# Patient Record
Sex: Male | Born: 1980 | Race: White | Hispanic: No | Marital: Single | Smoking: Current every day smoker
Health system: Southern US, Community
[De-identification: ages and names within clinical notes are randomized; demographics above are authoritative.]

## PROBLEM LIST (undated history)

## (undated) DIAGNOSIS — R768 Other specified abnormal immunological findings in serum: Secondary | ICD-10-CM

## (undated) DIAGNOSIS — E7800 Pure hypercholesterolemia, unspecified: Secondary | ICD-10-CM

## (undated) DIAGNOSIS — R002 Palpitations: Secondary | ICD-10-CM

## (undated) DIAGNOSIS — F1910 Other psychoactive substance abuse, uncomplicated: Secondary | ICD-10-CM

## (undated) DIAGNOSIS — E669 Obesity, unspecified: Secondary | ICD-10-CM

## (undated) DIAGNOSIS — F419 Anxiety disorder, unspecified: Secondary | ICD-10-CM

## (undated) HISTORY — DX: Anxiety disorder, unspecified: F41.9

## (undated) HISTORY — DX: Obesity, unspecified: E66.9

## (undated) HISTORY — PX: OTHER SURGICAL HISTORY: SHX169

## (undated) HISTORY — DX: Palpitations: R00.2

## (undated) HISTORY — DX: Other psychoactive substance abuse, uncomplicated: F19.10

## (undated) HISTORY — DX: Other specified abnormal immunological findings in serum: R76.8

---

## 2002-03-13 ENCOUNTER — Emergency Department (HOSPITAL_COMMUNITY): Admission: EM | Admit: 2002-03-13 | Discharge: 2002-03-13 | Payer: Self-pay | Admitting: Emergency Medicine

## 2006-10-10 ENCOUNTER — Emergency Department (HOSPITAL_COMMUNITY): Admission: EM | Admit: 2006-10-10 | Discharge: 2006-10-11 | Payer: Self-pay | Admitting: *Deleted

## 2006-10-15 ENCOUNTER — Emergency Department (HOSPITAL_COMMUNITY): Admission: EM | Admit: 2006-10-15 | Discharge: 2006-10-15 | Payer: Self-pay | Admitting: Emergency Medicine

## 2007-02-12 DIAGNOSIS — F1910 Other psychoactive substance abuse, uncomplicated: Secondary | ICD-10-CM

## 2007-02-12 DIAGNOSIS — F411 Generalized anxiety disorder: Secondary | ICD-10-CM | POA: Insufficient documentation

## 2007-08-14 ENCOUNTER — Emergency Department (HOSPITAL_COMMUNITY): Admission: EM | Admit: 2007-08-14 | Discharge: 2007-08-14 | Payer: Self-pay | Admitting: Emergency Medicine

## 2007-09-07 ENCOUNTER — Emergency Department (HOSPITAL_COMMUNITY): Admission: EM | Admit: 2007-09-07 | Discharge: 2007-09-08 | Payer: Self-pay | Admitting: Emergency Medicine

## 2007-10-06 ENCOUNTER — Encounter (INDEPENDENT_AMBULATORY_CARE_PROVIDER_SITE_OTHER): Payer: Self-pay | Admitting: *Deleted

## 2007-11-06 ENCOUNTER — Ambulatory Visit: Payer: Self-pay | Admitting: Family Medicine

## 2007-11-06 DIAGNOSIS — F410 Panic disorder [episodic paroxysmal anxiety] without agoraphobia: Secondary | ICD-10-CM

## 2007-11-10 DIAGNOSIS — F17200 Nicotine dependence, unspecified, uncomplicated: Secondary | ICD-10-CM

## 2007-12-01 ENCOUNTER — Ambulatory Visit: Payer: Self-pay | Admitting: Internal Medicine

## 2007-12-16 ENCOUNTER — Ambulatory Visit: Payer: Self-pay | Admitting: Internal Medicine

## 2008-01-19 ENCOUNTER — Ambulatory Visit: Payer: Self-pay | Admitting: Internal Medicine

## 2008-01-19 DIAGNOSIS — E785 Hyperlipidemia, unspecified: Secondary | ICD-10-CM

## 2008-02-09 ENCOUNTER — Ambulatory Visit: Payer: Self-pay | Admitting: Internal Medicine

## 2008-03-11 ENCOUNTER — Ambulatory Visit: Payer: Self-pay | Admitting: Internal Medicine

## 2008-03-15 ENCOUNTER — Telehealth (INDEPENDENT_AMBULATORY_CARE_PROVIDER_SITE_OTHER): Payer: Self-pay | Admitting: Internal Medicine

## 2008-03-18 LAB — CONVERTED CEMR LAB
Direct LDL: 166.6 mg/dL
Total CHOL/HDL Ratio: 4.8
Triglycerides: 78 mg/dL (ref 0–149)

## 2008-03-22 ENCOUNTER — Telehealth (INDEPENDENT_AMBULATORY_CARE_PROVIDER_SITE_OTHER): Payer: Self-pay | Admitting: *Deleted

## 2008-03-22 DIAGNOSIS — J019 Acute sinusitis, unspecified: Secondary | ICD-10-CM

## 2008-03-29 ENCOUNTER — Ambulatory Visit: Payer: Self-pay | Admitting: Internal Medicine

## 2008-04-26 ENCOUNTER — Ambulatory Visit: Payer: Self-pay | Admitting: Internal Medicine

## 2008-05-24 ENCOUNTER — Ambulatory Visit: Payer: Self-pay | Admitting: Family Medicine

## 2008-05-25 ENCOUNTER — Encounter: Payer: Self-pay | Admitting: Family Medicine

## 2008-05-26 ENCOUNTER — Telehealth (INDEPENDENT_AMBULATORY_CARE_PROVIDER_SITE_OTHER): Payer: Self-pay | Admitting: *Deleted

## 2008-06-17 ENCOUNTER — Ambulatory Visit: Payer: Self-pay | Admitting: Family Medicine

## 2008-07-13 ENCOUNTER — Ambulatory Visit: Payer: Self-pay | Admitting: Family Medicine

## 2008-07-20 ENCOUNTER — Encounter: Payer: Self-pay | Admitting: Family Medicine

## 2008-08-09 ENCOUNTER — Ambulatory Visit: Payer: Self-pay | Admitting: Family Medicine

## 2008-09-05 ENCOUNTER — Ambulatory Visit: Payer: Self-pay | Admitting: Family Medicine

## 2008-09-30 ENCOUNTER — Ambulatory Visit: Payer: Self-pay | Admitting: Family Medicine

## 2008-10-03 ENCOUNTER — Telehealth: Payer: Self-pay | Admitting: Family Medicine

## 2008-10-17 ENCOUNTER — Ambulatory Visit: Payer: Self-pay | Admitting: Family Medicine

## 2008-10-20 LAB — CONVERTED CEMR LAB: HDL: 38.6 mg/dL — ABNORMAL LOW (ref >39.0)

## 2008-10-26 ENCOUNTER — Ambulatory Visit: Payer: Self-pay | Admitting: Family Medicine

## 2008-10-26 DIAGNOSIS — R5381 Other malaise: Secondary | ICD-10-CM

## 2008-10-26 DIAGNOSIS — M25569 Pain in unspecified knee: Secondary | ICD-10-CM

## 2008-10-26 DIAGNOSIS — R5383 Other fatigue: Secondary | ICD-10-CM

## 2008-11-02 ENCOUNTER — Telehealth (INDEPENDENT_AMBULATORY_CARE_PROVIDER_SITE_OTHER): Payer: Self-pay | Admitting: *Deleted

## 2008-11-08 ENCOUNTER — Encounter (INDEPENDENT_AMBULATORY_CARE_PROVIDER_SITE_OTHER): Payer: Self-pay | Admitting: *Deleted

## 2008-11-24 ENCOUNTER — Ambulatory Visit: Payer: Self-pay | Admitting: Family Medicine

## 2008-12-13 ENCOUNTER — Encounter (INDEPENDENT_AMBULATORY_CARE_PROVIDER_SITE_OTHER): Payer: Self-pay | Admitting: *Deleted

## 2008-12-28 ENCOUNTER — Ambulatory Visit: Payer: Self-pay | Admitting: Family Medicine

## 2009-01-24 ENCOUNTER — Ambulatory Visit: Payer: Self-pay | Admitting: Family Medicine

## 2009-02-09 ENCOUNTER — Encounter: Payer: Self-pay | Admitting: Family Medicine

## 2009-02-20 ENCOUNTER — Ambulatory Visit: Payer: Self-pay | Admitting: Family Medicine

## 2009-03-20 ENCOUNTER — Ambulatory Visit: Payer: Self-pay | Admitting: Family Medicine

## 2009-04-12 ENCOUNTER — Ambulatory Visit: Payer: Self-pay | Admitting: Family Medicine

## 2009-05-12 ENCOUNTER — Ambulatory Visit: Payer: Self-pay | Admitting: Family Medicine

## 2009-06-07 ENCOUNTER — Encounter: Payer: Self-pay | Admitting: Family Medicine

## 2009-06-08 ENCOUNTER — Ambulatory Visit: Payer: Self-pay | Admitting: Family Medicine

## 2009-06-08 DIAGNOSIS — R0789 Other chest pain: Secondary | ICD-10-CM | POA: Insufficient documentation

## 2009-06-08 DIAGNOSIS — R0602 Shortness of breath: Secondary | ICD-10-CM | POA: Insufficient documentation

## 2009-06-09 LAB — CONVERTED CEMR LAB
AST: 29 units/L (ref 0–37)
Albumin: 4.3 g/dL (ref 3.5–5.2)
Alkaline Phosphatase: 57 units/L (ref 39–117)
Basophils Absolute: 0 10*3/uL (ref 0.0–0.1)
CO2: 32 meq/L (ref 19–32)
Calcium: 9.6 mg/dL (ref 8.4–10.5)
Cholesterol: 223 mg/dL — ABNORMAL HIGH (ref 0–200)
Folate: 7.9 ng/mL
Glucose, Bld: 102 mg/dL — ABNORMAL HIGH (ref 70–99)
Lymphocytes Relative: 22.3 % (ref 12.0–46.0)
Monocytes Relative: 6 % (ref 3.0–12.0)
Neutrophils Relative %: 71 % (ref 43.0–77.0)
Platelets: 213 10*3/uL (ref 150.0–400.0)
Potassium: 4.7 meq/L (ref 3.5–5.1)
RDW: 11.2 % — ABNORMAL LOW (ref 11.5–14.6)
Sodium: 141 meq/L (ref 135–145)
TSH: 0.73 microintl units/mL (ref 0.35–5.50)
Total CHOL/HDL Ratio: 5
Total Protein: 7.3 g/dL (ref 6.0–8.3)

## 2009-07-07 ENCOUNTER — Telehealth: Payer: Self-pay | Admitting: Family Medicine

## 2009-07-25 ENCOUNTER — Telehealth: Payer: Self-pay | Admitting: Family Medicine

## 2009-08-08 ENCOUNTER — Ambulatory Visit: Payer: Self-pay | Admitting: Family Medicine

## 2009-08-08 DIAGNOSIS — R7309 Other abnormal glucose: Secondary | ICD-10-CM

## 2009-09-01 ENCOUNTER — Ambulatory Visit: Payer: Self-pay | Admitting: Family Medicine

## 2009-09-05 ENCOUNTER — Telehealth (INDEPENDENT_AMBULATORY_CARE_PROVIDER_SITE_OTHER): Payer: Self-pay | Admitting: *Deleted

## 2009-09-05 ENCOUNTER — Telehealth: Payer: Self-pay | Admitting: Family Medicine

## 2009-10-04 ENCOUNTER — Encounter: Payer: Self-pay | Admitting: Family Medicine

## 2009-10-27 ENCOUNTER — Telehealth (INDEPENDENT_AMBULATORY_CARE_PROVIDER_SITE_OTHER): Payer: Self-pay | Admitting: *Deleted

## 2009-11-27 ENCOUNTER — Telehealth (INDEPENDENT_AMBULATORY_CARE_PROVIDER_SITE_OTHER): Payer: Self-pay | Admitting: *Deleted

## 2009-12-26 ENCOUNTER — Telehealth: Payer: Self-pay | Admitting: Family Medicine

## 2010-01-24 ENCOUNTER — Telehealth: Payer: Self-pay | Admitting: Family Medicine

## 2010-02-19 ENCOUNTER — Telehealth: Payer: Self-pay | Admitting: Family Medicine

## 2010-03-19 ENCOUNTER — Telehealth (INDEPENDENT_AMBULATORY_CARE_PROVIDER_SITE_OTHER): Payer: Self-pay | Admitting: *Deleted

## 2010-04-13 ENCOUNTER — Telehealth: Payer: Self-pay | Admitting: Family Medicine

## 2010-05-10 ENCOUNTER — Telehealth: Payer: Self-pay | Admitting: Family Medicine

## 2010-06-05 ENCOUNTER — Ambulatory Visit: Payer: Self-pay | Admitting: Family Medicine

## 2010-06-05 ENCOUNTER — Encounter (INDEPENDENT_AMBULATORY_CARE_PROVIDER_SITE_OTHER): Payer: Self-pay | Admitting: *Deleted

## 2010-07-02 ENCOUNTER — Telehealth: Payer: Self-pay | Admitting: Family Medicine

## 2010-07-09 ENCOUNTER — Encounter: Payer: Self-pay | Admitting: Family Medicine

## 2010-07-27 ENCOUNTER — Telehealth: Payer: Self-pay | Admitting: Family Medicine

## 2010-08-24 ENCOUNTER — Telehealth: Payer: Self-pay | Admitting: Family Medicine

## 2010-09-03 ENCOUNTER — Encounter (INDEPENDENT_AMBULATORY_CARE_PROVIDER_SITE_OTHER): Payer: Self-pay | Admitting: *Deleted

## 2010-09-03 ENCOUNTER — Ambulatory Visit: Payer: Self-pay | Admitting: Family Medicine

## 2010-09-03 DIAGNOSIS — S5420XA Injury of radial nerve at forearm level, unspecified arm, initial encounter: Secondary | ICD-10-CM | POA: Insufficient documentation

## 2010-09-18 ENCOUNTER — Emergency Department (HOSPITAL_COMMUNITY): Admission: EM | Admit: 2010-09-18 | Discharge: 2010-09-18 | Payer: Self-pay | Admitting: Family Medicine

## 2010-09-19 ENCOUNTER — Telehealth: Payer: Self-pay | Admitting: Family Medicine

## 2010-10-15 ENCOUNTER — Telehealth: Payer: Self-pay | Admitting: Family Medicine

## 2010-10-16 ENCOUNTER — Telehealth: Payer: Self-pay | Admitting: Family Medicine

## 2010-10-17 ENCOUNTER — Telehealth: Payer: Self-pay | Admitting: Family Medicine

## 2010-11-14 ENCOUNTER — Telehealth: Payer: Self-pay | Admitting: Family Medicine

## 2010-11-25 ENCOUNTER — Emergency Department (HOSPITAL_COMMUNITY)
Admission: EM | Admit: 2010-11-25 | Discharge: 2010-11-25 | Payer: Self-pay | Source: Home / Self Care | Admitting: Emergency Medicine

## 2010-12-12 ENCOUNTER — Telehealth: Payer: Self-pay | Admitting: Family Medicine

## 2011-01-08 NOTE — Progress Notes (Signed)
Summary: xanax  Phone Note Refill Request Message from:  Patient on July 27, 2010 9:09 AM  Refills Requested: Medication #1:  ALPRAZOLAM 1 MG  TABS Take 1 tab every 8 hours.   Supply Requested[facility] [phone]   Method Requested: Telephone to Pharmacy Initial call taken by: Benny Lennert CMA Duncan Dull),  July 27, 2010 9:09 AM  Follow-up for Phone Call        Rx called to pharmacy Follow-up by: Benny Lennert CMA Duncan Dull),  July 27, 2010 2:52 PM    Prescriptions: ALPRAZOLAM 1 MG  TABS (ALPRAZOLAM) Take 1 tab every 8 hours.  #90 x 0   Entered and Authorized by:   Kerby Nora MD   Signed by:   Kerby Nora MD on 07/27/2010   Method used:   Telephoned to ...       [facility]* (retail)       [address] [place], [place]  [address]       Ph: [phone] or [phone]       Fax: [phone]   RxID:   [id number]

## 2011-01-08 NOTE — Progress Notes (Signed)
Summary: refill request for xanax  Phone Note Refill Request Call back at Home Phone [phone] Message from:  Patient  Refills Requested: Medication #1:  ALPRAZOLAM 1 MG  TABS Take 1 tab every 8 hours. Please send to [facility].    Initial call taken by: Lowella Petties CMA,  February 19, 2010 12:25 PM  Follow-up for Phone Call        Patient called and says that he is out of medication and is having an attack. Wants some called in today, please.  2 days early.  Laurie's son. Follow-up by: Benny Lennert CMA Duncan Dull),  February 19, 2010 4:24 PM  Additional Follow-up for Phone Call Additional follow up Details #1::        Out. Do not want this patient to seize and will call in acutely. Additional Follow-up by: Hannah Beat MD,  February 19, 2010 5:12 PM    Prescriptions: ALPRAZOLAM 1 MG  TABS (ALPRAZOLAM) Take 1 tab every 8 hours.  #90 x 0   Entered and Authorized by:   Hannah Beat MD   Signed by:   Hannah Beat MD on 02/19/2010   Method used:   Telephoned to ...       [facility]* (retail)       [address] [place], [place]  [address]       Ph: [phone] or [phone]       Fax: [phone]   RxID:   [id number]   Appended Document: refill request for xanax rx called in

## 2011-01-08 NOTE — Progress Notes (Signed)
Summary: alprazolam   Phone Note Refill Request Message from:  Patient on October 15, 2010 11:41 AM  Refills Requested: Medication #1:  ALPRAZOLAM 1 MG  TABS Take 1 tab every 8 hours.   Last Refilled: 09/19/2010 [facility]. Patient is out of medication, patient says that Dr. Ermalene Searing is aware that he sometimes needs to take extra.   Initial call taken by: Melody Comas,  October 15, 2010 11:42 AM  Follow-up for Phone Call        No, this is early, against controlled substance agreement. I will not fill.  cc: AEB, who will be here in AM.  Follow-up by: Hannah Beat MD,  October 15, 2010 12:13 PM  Additional Follow-up for Phone Call Additional follow up Details #1::        Agree..no refill until 11/12 Additional Follow-up by: Kerby Nora MD,  October 15, 2010 11:29 PM    Additional Follow-up for Phone Call Additional follow up Details #2::    Patient mother advised and will contact patient.Consuello Masse CMA   Follow-up by: Benny Lennert CMA Duncan Dull),  October 16, 2010 8:29 AM

## 2011-01-08 NOTE — Assessment & Plan Note (Signed)
Summary: refill medication/hmw   Vital Signs:  Patient profile:   30 year old male Height:      68 inches Weight:      143.4 pounds BMI:     21.88 Temp:     98.5 degrees F oral Pulse rate:   72 / minute Pulse rhythm:   regular BP sitting:   100 / 70  (left arm) Cuff size:   regular  Vitals Entered By: Benny Lennert CMA Duncan Dull) (June 05, 2010 12:09 PM)  History of Present Illness: Chief complaint refill medication  Anxiety: Moderate  control on xanax 1 mg three times a day. Some increase in panic attacks in last months. Paxil buspar, wellbutrin  had a lot of SE to these meds. Not interested in starting SSRI or other medicaiton at this time.   Wokring on weaning off methadone..had to temporarily increase back up to 130 mg. Plans to slo wly wean off in next few months.    Problems Prior to Update: 1)  Prediabetes  (ICD-790.29) 2)  Shortness of Breath  (ICD-786.05) 3)  Chest Pain, Atypical  (ICD-786.59) 4)  Fatigue  (ICD-780.79) 5)  Patello-femoral Syndrome  (ICD-719.46) 6)  Acute Sinusitis, Unspecified  (ICD-461.9) 7)  Hyperlipidemia  (ICD-272.4) 8)  Nicotine Addiction  (ICD-305.1) 9)  Anxiety State, Unspecified  (ICD-300.00) 10)  Panic Disorder  (ICD-300.01) 11)  Drug Abuse  (ICD-305.90)  Current Medications (verified): 1)  Methadone Hcl Intensol 10 Mg/ml  Conc (Methadone Hcl) .... Take 90 Mg Daily. 2)  Alprazolam 1 Mg  Tabs (Alprazolam) .... Take 1 Tab Every 8 Hours. 3)  Epipen 0.3 Mg/0.83ml (1:1000) Devi (Epinephrine Hcl (Anaphylaxis)) .... If Stung By Bee  Allergies: 1)  ! * Bee Stings  Past History:  Past medical, surgical, family and social histories (including risk factors) reviewed, and no changes noted (except as noted below).  Past Medical History: Reviewed history from 11/06/2007 and no changes required. Hx of drug abuse since age 74 until now.  Heart palpitations Extreme anxiety and panic attacks.   Past Surgical History: Reviewed history from  11/06/2007 and no changes required. No surgical history.  Family History: Reviewed history from 06/08/2009 and no changes required. Aunt MI , age late 57s. PGM: melanoma, CVA MGM: healthy mother: anxiety aunt : DM aunt, CAD   Social History: Reviewed history from 08/09/2008 and no changes required. Unemployed Single- has 1 child, daughter. Drug use-yes, hx of Hx of ETOH but not in the past year. Current Smoker 1ppd  Review of Systems General:  Denies fatigue and fever. CV:  Denies chest pain or discomfort. Resp:  Denies shortness of breath. GI:  Denies abdominal pain. GU:  Denies dysuria.  Physical Exam  General:  Well-developed,well-nourished,in no acute distress; alert,appropriate and cooperative throughout examination Mouth:  MMM Neck:  no carotid bruit or thyromegaly no cervical or supraclavicular lymphadenopathy  Lungs:  Normal respiratory effort, chest expands symmetrically. Lungs are clear to auscultation, no crackles or wheezes. Heart:  Normal rate and regular rhythm. S1 and S2 normal without gallop, murmur, click, rub or other extra sounds. Abdomen:  Bowel sounds positive,abdomen soft and non-tender without masses, organomegaly or hernias noted. Pulses:  R and L posterior tibial pulses are full and equal bilaterally  Extremities:  no edmea   Impression & Recommendations:  Problem # 1:  ANXIETY STATE, UNSPECIFIED (ICD-300.00) Stable on current medicaiton. Discussed referralt o pshychiatry for better control of anixety with increase in medicaiton. Pt not interested in  referralt at this  time. Continue to work on [employer] and [employer]. QUIT smoking.  His updated medication list for this problem includes:    Alprazolam 1 Mg Tabs (Alprazolam) .Marland Kitchen... Take 1 tab every 8 hours.  Complete Medication List: 1)  Methadone Hcl Intensol 10 Mg/ml Conc (Methadone hcl) .... Take 90 mg daily. 2)  Alprazolam 1 Mg Tabs (Alprazolam) .... Take 1 tab every 8 hours. 3)  Epipen  0.3 Mg/0.30ml (1:1000) Devi (Epinephrine hcl (anaphylaxis)) .... If stung by bee  Patient Instructions: 1)  Schedule CPX in next 1-2 months with fasting labs prior..lipids, CMET Dx 272.0 Prescriptions: ALPRAZOLAM 1 MG  TABS (ALPRAZOLAM) Take 1 tab every 8 hours.  #90 x 0   Entered and Authorized by:   Kerby Nora MD   Signed by:   Kerby Nora MD on 06/05/2010   Method used:   Print then Give to Patient   RxID:   [id number]   Current Allergies (reviewed today): ! * BEE STINGS

## 2011-01-08 NOTE — Progress Notes (Signed)
Summary: needs refill on xanax  Phone Note Call from Patient   Caller: Patient Call For: Dr. Patsy Lager Summary of Call: Dr. Patsy Lager, Frederick Young is asking for a refill on xanax, he says tomorrow will be 30 days since last filled, and he is out since last night.  He says Dr. Ermalene Searing is aware that he might take and extra one now and then.   Please send to [facility]. Initial call taken by: Lowella Petties CMA,  March 19, 2010 10:15 AM  Follow-up for Phone Call        call to [facility]  in this case, pt out.  ok to refill I think  He needs to remember when he is going to be out of medications and call at the last minute.  Follow-up by: Hannah Beat MD,  March 19, 2010 10:28 AM  Additional Follow-up for Phone Call Additional follow up Details #1::        Phone Call Completed, Rx Called In Additional Follow-up by: Benny Lennert CMA Duncan Dull),  March 19, 2010 11:14 AM    Prescriptions: ALPRAZOLAM 1 MG  TABS (ALPRAZOLAM) Take 1 tab every 8 hours.  #90 x 0   Entered and Authorized by:   Hannah Beat MD   Signed by:   Hannah Beat MD on 03/19/2010   Method used:   Telephoned to ...       [facility]* (retail)       [address] [place], [place]  [address]       Ph: [phone] or [phone]       Fax: [phone]   RxID:   [id number]

## 2011-01-08 NOTE — Letter (Signed)
Summary: Coordination of Care Form/[facility]  Coordination of Care Form/[facility]   Imported By: Lanelle Bal 07/11/2010 11:00:08  _____________________________________________________________________  External Attachment:    Type:   Image     Comment:   External Document

## 2011-01-08 NOTE — Progress Notes (Signed)
Summary: severe anxiety  Phone Note Call from Patient Call back at Home Phone [phone]   Caller: Patient Call For: Frederick Young Summary of Call: Patient says that he is completely out of medciation (alprazolam)  and that he alway has to get this done 2 days early and that you are aware that he needs to take extras sometimes for his severe anxiety. He is asking if you could go ahead and do refill. Please advise.  Initial call taken by: Melody Comas,  October 16, 2010 11:04 AM  Follow-up for Phone Call        Loking back pt has continued to have prescription refills inch earlier and earlier each month. Our agreement is that he uses 90 per every 30 days.  I will refill his medication today, but we will not refill early again. he obviously feels he needs increase in med or at least to use more frequently.Marland KitchenMarland KitchenMarland KitchenI will go ahead and make a referral to pshychiatry for him.  Follow-up by: Frederick Young,  October 16, 2010 1:23 PM  Additional Follow-up for Phone Call Additional follow up Details #1::        Patient does agree that he needs psychiatry but, he doesnt have insurance. Patient says that he going to make appt to see you next month.Consuello Masse CMA   Additional Follow-up by: Benny Lennert CMA Duncan Dull),  October 16, 2010 1:32 PM    Additional Follow-up for Phone Call Additional follow up Details #2::    Any options for him Shirlee L[facility]ick?  Follow-up by: Frederick Young,  October 16, 2010 1:36 PM  Additional Follow-up for Phone Call Additional follow up Details #3:: Details for Additional Follow-up Action Taken: Rx [facility].Consuello Masse CMA   Additional Follow-up by: Benny Lennert CMA Duncan Dull),  October 16, 2010 2:09 PM  Prescriptions: ALPRAZOLAM 1 MG  TABS (ALPRAZOLAM) Take 1 tab every 8 hours.  #90 x 0   Entered and Authorized by:   Frederick Young   Signed by:   Frederick Young on 10/16/2010   Method used:   Telephoned to ...   [facility]* (retail)       [address] [place], [place]  [address]       Ph: [phone] or [phone]       Fax: [phone]   RxID:   [id number]

## 2011-01-08 NOTE — Progress Notes (Signed)
  Phone Note Refill Request Message from:  Patient on September 19, 2010 11:44 AM  Refills Requested: Medication #1:  ALPRAZOLAM 1 MG  TABS Take 1 tab every 8 hours.   Supply Requested: 1 month [facility]   Method Requested: Telephone to Pharmacy Initial call taken by: Benny Lennert CMA Duncan Dull),  September 19, 2010 11:45 AM  Follow-up for Phone Call        Rx called to pharmacy Follow-up by: Benny Lennert CMA Duncan Dull),  September 19, 2010 2:06 PM    Prescriptions: ALPRAZOLAM 1 MG  TABS (ALPRAZOLAM) Take 1 tab every 8 hours.  #90 x 0   Entered and Authorized by:   Kerby Nora MD   Signed by:   Kerby Nora MD on 09/19/2010   Method used:   Telephoned to ...       [facility]* (retail)       [address] [place], [place]  [address]       Ph: [phone] or [phone]       Fax: [phone]   RxID:   [id number]

## 2011-01-08 NOTE — Progress Notes (Signed)
Summary: Xanax  Phone Note Refill Request Call back at Home Phone [phone] Message from:  Patient on January 24, 2010 2:38 PM  Refills Requested: Medication #1:  ALPRAZOLAM 1 MG  TABS Take 1 tab every 8 hours.   Supply Requested: 1 month [facility]   Method Requested: Telephone to Pharmacy Initial call taken by: Benny Lennert CMA Duncan Dull),  January 24, 2010 2:38 PM Caller: Patient Call For: Kerby Nora MD  Follow-up for Phone Call        Rx called to pharmacy Follow-up by: Benny Lennert CMA Duncan Dull),  January 24, 2010 3:25 PM    Prescriptions: ALPRAZOLAM 1 MG  TABS (ALPRAZOLAM) Take 1 tab every 8 hours.  #90 x 0   Entered and Authorized by:   Kerby Nora MD   Signed by:   Kerby Nora MD on 01/24/2010   Method used:   Telephoned to ...       [facility]* (retail)       [address] [place], [place]  [address]       Ph: [phone] or [phone]       Fax: [phone]   RxID:   [id number]

## 2011-01-08 NOTE — Progress Notes (Signed)
Summary: needs refill on xanax  Phone Note Refill Request Message from:  Patient  Refills Requested: Medication #1:  ALPRAZOLAM 1 MG  TABS Take 1 tab every 8 hours.   Last Refilled: 11/28/2009 Please send to [facility], Thayer Ohm will call back to schedule follow up appt. He has some things he wants to discuss.  Initial call taken by: Lowella Petties CMA,  December 26, 2009 1:03 PM  Follow-up for Phone Call        rx called in Follow-up by: Benny Lennert CMA Duncan Dull),  December 26, 2009 2:27 PM    Prescriptions: ALPRAZOLAM 1 MG  TABS (ALPRAZOLAM) Take 1 tab every 8 hours.  #90 x 0   Entered and Authorized by:   Kerby Nora MD   Signed by:   Kerby Nora MD on 12/26/2009   Method used:   Telephoned to ...       [facility]* (retail)       [address] [place], [place]  [address]       Ph: [phone] or [phone]       Fax: [phone]   RxID:   [id number]

## 2011-01-08 NOTE — Progress Notes (Signed)
Summary: Rx Alprazolam  Phone Note Refill Request Call back at Home Phone [phone] Message from:  Patient on July 02, 2010 12:33 PM  Refills Requested: Medication #1:  ALPRAZOLAM 1 MG  TABS Take 1 tab every 8 hours. Patient is requesting a refill request.  He is going through a lot of stress with his girlfriend and would like this medication refilled.  [facility]ses [facility].  Please advise.   Method Requested: Telephone to Pharmacy Initial call taken by: Linde Gillis CMA Duncan Dull),  July 02, 2010 12:34 PM  Follow-up for Phone Call        Called into [facility] as directed. Janee Morn CMA  July 02, 2010 2:35 PM     Prescriptions: ALPRAZOLAM 1 MG  TABS (ALPRAZOLAM) Take 1 tab every 8 hours.  #90 x 0   Entered by:   Janee Morn CMA   Authorized by:   Hannah Beat MD   Signed by:   Janee Morn CMA on 07/02/2010   Method used:   Telephoned to ...       [facility]* (retail)       [address]       [place], [place]  [address]       Ph: [phone]       Fax: [phone]   RxID:   [id number] ALPRAZOLAM 1 MG  TABS (ALPRAZOLAM) Take 1 tab every 8 hours.  #90 x 0   Entered and Authorized by:   Hannah Beat MD   Signed by:   Hannah Beat MD on 07/02/2010   Method used:   Telephoned to ...       [facility]* (retail)       [address]       [place], [place]  [address]       Ph: [phone]       Fax: [phone]   RxID:   [id number]

## 2011-01-08 NOTE — Assessment & Plan Note (Signed)
Summary: numbess in hand and arm/hmw   Vital Signs:  Patient profile:   30 year old male Height:      68 inches Weight:      147.0 pounds BMI:     22.43 Temp:     98.0 degrees F oral Pulse rate:   72 / minute Pulse rhythm:   regular BP sitting:   120 / 70  (left arm) Cuff size:   regular  Vitals Entered By: Benny Lennert CMA Duncan Dull) (September 03, 2010 3:27 PM)  History of Present Illness: Chief complaint left arm and hand numbness  30 year old male:  The night before last, slep on his arm and will hurt and tingle. Now so much hurt right now, will get some tingling and   L arm and hand numbness   patient presents today with an acute weakness at the extensor aspect of the wrist on the left. He does recall sleeping on his arm and more cramps base of his girlfriend's house.  Notable weakness in wrist extension and extension at the MCPs with paresthesias. He also notes some pain in and around the proximal brachioradialis on the dorsum of the proximal forearm.  Denies any numbness when I ask him about this. No trauma or injury. No neck injury.  Allergies: 1)  ! * Bee Stings  Past History:  Past medical, surgical, family and social histories (including risk factors) reviewed, and no changes noted (except as noted below).  Past Medical History: Reviewed history from 11/06/2007 and no changes required. Hx of drug abuse since age 25 until now.  Heart palpitations Extreme anxiety and panic attacks.   Past Surgical History: Reviewed history from 11/06/2007 and no changes required. No surgical history.  Family History: Reviewed history from 06/08/2009 and no changes required. Aunt MI , age late 15s. PGM: melanoma, CVA MGM: healthy mother: anxiety aunt : DM aunt, CAD   Social History: Reviewed history from 08/09/2008 and no changes required. Unemployed Single- has 1 child, daughter. Drug use-yes, hx of Hx of ETOH but not in the past year. Current Smoker  1ppd  Review of Systems       recent birth of a child, anxiety, continues to be on methadone. No chest pain or shortness of breath. No slurred speech.  Physical Exam  General:  Well-developed,well-nourished,in no acute distress; alert,appropriate and cooperative throughout examination Head:  Normocephalic and atraumatic without obvious abnormalities. No apparent alopecia or balding. Ears:  no external deformities.   Nose:  no external deformity.   Lungs:  Normal respiratory effort, chest expands symmetrically. Lungs are clear to auscultation, no crackles or wheezes.   Detailed Neurologic Exam  Speech:    Speech is normal; fluent and spontaneous with normal comprehension Cognition:    The patient is oriented to person, place, and time; memory intact; language fluent; normal attention, concentration, and fund of knowledge Cranial Nerves:    The pupils are equal, round, and reactive to light. The fundi are normal and spontaneous venous pulsations are present. Visual fields are full to finger confrontation. Extraocular movements are intact. Trigeminal sensation is intact and the muscles of mastication are normal. The face is symmetric. The palate elevates in the midline. Voice is normal. Shoulder shrug is normal. The tongue has normal motion without fasciculations.  Coordination:    Normal finger to nose and heel to shin. Normal rapid alternating movements.   with the exception of left hand with extension motions of the wrist Gait:    Heel-toe  and tandem gait are normal.  Strength:    Right:       Shoulder abductor (supraspinatus): 5/5       Shoulder abductor (deltoid): 5/5       Biceps: 5/5       Triceps: 5/5       Wrist extensors: 5/5       Wrist flexors: 5/5       Handgrip: 5/5       Interossei: 5/5    Left:       Shoulder abductor (supraspinatus): 5/5       Shoulder abductor (deltoid): 5/5       Biceps: 5/5       Triceps: 5/5       Wrist extensors: 3+/5       Wrist  flexors: 5/5       Handgrip: 4/5       Interossei: 4/5 Vibratory Sensation:    Normal vibratory sensation in upper and lower extremities.  Light Touch:    Normal light touch sensation in upper and lower extremities.  Proprioception:    Normal proprioception in upper and lower extremities.  Pin Prick:    Normal sensation to pinprick in upper and lower extremities.  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 Male be due to trauma due to malpositioning during sleep.  Sensation intact. Spinal lesion is highly unlikely. I discussed this with the patient and his mother. for now, conservative treatment is most appropriate, and other diagnostic studies may be indicated if symptoms persist.  Place patient cockup wrist splint, maintain motion. We discussed this may take some weeks to fully resolve.  Orders: Wrist Splint Cock Up [id number])  Complete Medication List: 1)  Methadone Hcl Intensol 10 Mg/ml Conc (Methadone hcl) .... Take 90 mg daily. 2)  Alprazolam 1 Mg Tabs (Alprazolam) .... Take 1 tab every 8 hours. 3)  Epipen 0.3 Mg/0.46ml (1:1000) Devi (Epinephrine hcl (anaphylaxis)) .... If stung by bee Prescriptions: PREDNISONE 20 MG TABS (PREDNISONE) 2 by mouth x 5, then 1 by mouth x 3 days  #13 x 0   Entered and Authorized by:   Hannah Beat MD   Signed by:   Hannah Beat MD on 09/03/2010   Method used:   Electronically to        [facility]* (retail)       [address] [place], [place]  [address]       Ph: [phone] or [phone]       Fax: [phone]   RxID:   [id number]   Current Allergies (reviewed today): ! * BEE STINGS  Appended Document: numbess in hand and arm/hmw i ended not wanting him to take  prednisone, please call pharmacy and let them know to retract script.  Appended Document: numbess in hand and arm/hmw pharmacy advised.Consuello Masse CMA

## 2011-01-08 NOTE — Progress Notes (Signed)
Summary: Xanax  Phone Note Refill Request Message from:  Patient on May 10, 2010 2:32 PM  Refills Requested: Medication #1:  ALPRAZOLAM 1 MG  TABS Take 1 tab every 8 hours.   Supply Requested: 1 month [facility] [place] [phone]   Method Requested: Telephone to Pharmacy Initial call taken by: Benny Lennert CMA Duncan Dull),  May 10, 2010 2:33 PM  Follow-up for Phone Call        Over 6 months sincce last OV..needs appt before further refills after this.  Follow-up by: Kerby Nora MD,  May 10, 2010 11:19 PM  Additional Follow-up for Phone Call Additional follow up Details #1::        Rx called to pharmacy mother advised to notify patient to come in for more refills after this.Consuello Masse CMA  Additional Follow-up by: Benny Lennert CMA Duncan Dull),  May 11, 2010 7:34 AM    Prescriptions: ALPRAZOLAM 1 MG  TABS (ALPRAZOLAM) Take 1 tab every 8 hours.  #90 x 0   Entered and Authorized by:   Kerby Nora MD   Signed by:   Kerby Nora MD on 05/10/2010   Method used:   Telephoned to ...       [facility]* (retail)       [address] [place], [place]  [address]       Ph: [phone] or [phone]       Fax: [phone]   RxID:   [id number]

## 2011-01-08 NOTE — Progress Notes (Signed)
Summary: alprazolam  Phone Note Refill Request Message from:  Scriptline on August 24, 2010 10:30 AM  Refills Requested: Medication #1:  ALPRAZOLAM 1 MG  TABS Take 1 tab every 8 hours.   Supply Requested: [facility]    Method Requested: Telephone to Pharmacy Initial call taken by: Benny Lennert CMA Duncan Dull),  August 24, 2010 10:31 AM  Follow-up for Phone Call        Rx called to pharmacy Follow-up by: Benny Lennert CMA Duncan Dull),  August 24, 2010 12:34 PM    Prescriptions: ALPRAZOLAM 1 MG  TABS (ALPRAZOLAM) Take 1 tab every 8 hours.  #90 x 0   Entered and Authorized by:   Kerby Nora MD   Signed by:   Kerby Nora MD on 08/24/2010   Method used:   Telephoned to ...       [facility]* (retail)       [address] [place], [place]  [address]       Ph: [phone] or [phone]       Fax: [phone]   RxID:   [id number]

## 2011-01-08 NOTE — Progress Notes (Signed)
Summary: alprazolam   Phone Note Refill Request Message from:  Patient on November 14, 2010 8:43 AM  Refills Requested: Medication #1:  ALPRAZOLAM 1 MG  TABS Take 1 tab every 8 hours. Patient is requesting rx to be sent to [facility].   Initial call taken by: Melody Comas,  November 14, 2010 8:43 AM  Follow-up for Phone Call        Rx called to pharmacy.  Follow-up by: Melody Comas,  November 14, 2010 2:46 PM    Prescriptions: ALPRAZOLAM 1 MG  TABS (ALPRAZOLAM) Take 1 tab every 8 hours.  #90 x 0   Entered and Authorized by:   Kerby Nora MD   Signed by:   Kerby Nora MD on 11/14/2010   Method used:   Telephoned to ...       [facility]* (retail)       [address]       [place], [place]  [address]       Ph: [phone]       Fax: [phone]   RxID:   [id number]

## 2011-01-08 NOTE — Miscellaneous (Signed)
Summary: Controlled Substances Contract  Controlled Substances Contract   Imported By: Maryln Gottron 06/13/2010 13:49:05  _____________________________________________________________________  External Attachment:    Type:   Image     Comment:   External Document

## 2011-01-08 NOTE — Progress Notes (Signed)
Summary: psychiatry referral   Phone Note Call from Patient Call back at Home Phone [phone]   Caller: Patient Call For: Kerby Nora MD Summary of Call: Patient called to let you know that when he called to make his psychiatry  appt.  he was told that they only treat general anxiety, and not severe panic disorders. They told him that the only thing they could do would be to wean him off of xanax and try something else. Patient says that he has tried several other things before the xanax and nothing helped. He says that at this time he would prefer to continue with 3mg  daily of xanax than to be switched to something else. He says that he understands that he can not have this filled early anymore and will be careful not to use it up before refill is do.  Initial call taken by: Melody Comas,  October 17, 2010 4:22 PM  Follow-up for Phone Call        Noted.  Follow-up by: Kerby Nora MD,  October 17, 2010 4:48 PM

## 2011-01-08 NOTE — Progress Notes (Signed)
Summary: Rx Xanax  Phone Note Refill Request Message from:  Frederick Young on Apr 13, 2010 10:00 AM  Refills Requested: Medication #1:  ALPRAZOLAM 1 MG  TABS Take 1 tab every 8 hours.   Last Refilled: 03/19/2010 Frederick Young request Rx refill.  Please advise.  [facility].   Method Requested: Telephone to Pharmacy Initial call taken by: Linde Gillis CMA Duncan Dull),  Apr 13, 2010 10:02 AM  Follow-up for Phone Call        Rx called to pharmacy, Frederick Young notified. Follow-up by: Linde Gillis CMA Duncan Dull),  Apr 13, 2010 10:23 AM    Prescriptions: ALPRAZOLAM 1 MG  TABS (ALPRAZOLAM) Take 1 tab every 8 hours.  #90 x 0   Entered and Authorized by:   Kerby Nora MD   Signed by:   Kerby Nora MD on 04/13/2010   Method used:   Telephoned to ...       [facility]* (retail)       [address] [place], [place]  [address]       Ph: [phone] or [phone]       Fax: [phone]   RxID:   [id number]

## 2011-01-08 NOTE — Letter (Signed)
Summary: Controlled Substances Contract  [place] at [facility]  [address] [place], [place] [address]   Phone: [phone]  Fax: [phone]    [facility] Controlled Substances Contract         Patient Name: Frederick Young Patient DOB: 1980/12/22        Patient MRN:  [id number]        Physician's Name: _________________________________________   Patients must complete this contract before doctors at the [facility] office will be willing to prescribe controlled substances. I understand that: ___1)  I am responsible for my controlled substance medications.  If my prescription is lost, misplaced or stolen, or if I take more than prescribed, my doctor will not write me a new prescription. ___2)  I will not request or accept controlled substances or controlled substance prescriptions from any other doctor or clinic while I am receiving controlled substance treatment at [facility].  The ONLY exception is if controlled substances are prescribed for the treatment of an acute condition that is NOT the diagnosis for which I am receiving treatment at [facility].   I will call my physician at [facility] if I receive controlled substance or controlled substance prescriptions from anywhere else. ___3)  Controlled substance refills will be made ONLY during regular office hours. ___4)  Refills will not be made if I run out early.  Refills will not be made during work-in or urgent care visits.  Refills will NOT be made for "emergencies", such as on a Friday afternoon or by on call service at night or weekends.  I understand that I am required to call at least 2 business days prior to expiration date for controlled substance and/or needing controlled substance refills.   ___5)  I will not use illicit (illegal) drugs.  ___6)  I agree to take urine or blood drug tests when requested for routine screening. ___7)  I agree to use only ONE pharmacy  for filling ALL my controlled substance prescriptions.             Name and Location of Pharmacy:                                                                                                                  ___8)  I understand that my doctor may review my use of controlled substances using the [facility] Controlled Substance Reporting System. ___9)  If I behave in an abusive way towards [facility] Primary Care staff, my controlled substance prescriptions may be stopped, and I may be dismissed from this practice. ___10)  I understand that if I break any of the above terms of this contract, my pain prescription and/or treatment may be stopped immediately.  If I get controlled substances from someone else or use illegal drugs, I may be reported to all my doctors, medical facilities and appropriate authorities.  I have been fully informed by [facility] Primary Care physicians and the staff regarding psychological dependence (addiction)  to controlled substances.  I understand that I should stop my medication ONLY under medical supervision or I may have withdrawal symptoms.  ***I have read this contract and it has been explained to me by [facility] physicians and/or their staff, and I fully understand the consequences of violating any of the terms of this contract.    Patient Signature _________________________________________ Date June 05, 2010   [facility] Staff Signature ____________________________________ Date June 05, 2010

## 2011-01-08 NOTE — Letter (Signed)
Summary: Controlled Substances Contract  [place] at [facility]  [address] [place], [place] [address]   Phone: [phone]  Fax: [phone]    [facility] Controlled Substances Contract         Patient Name: Frederick Young Patient DOB: [birth year]        Patient MRN:  [id number]        Physician's Name: _________________________________________   Patients must complete this contract before doctors at the [facility] office will be willing to prescribe controlled substances. I understand that: ___1)  I am responsible for my controlled substance medications.  If my prescription is lost, misplaced or stolen, or if I take more than prescribed, my doctor will not write me a new prescription. ___2)  I will not request or accept controlled substances or controlled substance prescriptions from any other doctor or clinic while I am receiving controlled substance treatment at [facility].  The ONLY exception is if controlled substances are prescribed for the treatment of an acute condition that is NOT the diagnosis for which I am receiving treatment at [facility].   I will call my physician at [facility] if I receive controlled substance or controlled substance prescriptions from anywhere else. ___3)  Controlled substance refills will be made ONLY during regular office hours. ___4)  Refills will not be made if I run out early.  Refills will not be made during work-in or urgent care visits.  Refills will NOT be made for "emergencies", such as on a Friday afternoon or by on call service at night or weekends.  I understand that I am required to call at least 2 business days prior to expiration date for controlled substance and/or needing controlled substance refills.   ___5)  I will not use illicit (illegal) drugs.  ___6)  I agree to take urine or blood drug tests when requested for routine screening. ___7)  I agree to use only ONE pharmacy  for filling ALL my controlled substance prescriptions.             Name and Location of Pharmacy:                                                                                                                  ___8)  I understand that my doctor may review my use of controlled substances using the [facility] Controlled Substance Reporting System. ___9)  If I behave in an abusive way towards [facility] Primary Care staff, my controlled substance prescriptions may be stopped, and I may be dismissed from this practice. ___10)  I understand that if I break any of the above terms of this contract, my pain prescription and/or treatment may be stopped immediately.  If I get controlled substances from someone else or use illegal drugs, I may be reported to all my doctors, medical facilities and appropriate authorities.  I have been fully informed by [facility] Primary Care physicians and the staff regarding psychological dependence (addiction)  to controlled substances.  I understand that I should stop my medication ONLY under medical supervision or I may have withdrawal symptoms.  ***I have read this contract and it has been explained to me by [facility] physicians and/or their staff, and I fully understand the consequences of violating any of the terms of this contract.    Patient Signature _________________________________________ Date September 03, 2010   [facility] Staff Signature ____________________________________ Date September 03, 2010

## 2011-01-10 ENCOUNTER — Telehealth: Payer: Self-pay | Admitting: Family Medicine

## 2011-01-10 NOTE — Progress Notes (Signed)
Summary: alprazolam  Phone Note Refill Request Message from:  Patient on December 12, 2010 12:42 PM  Refills Requested: Medication #1:  ALPRAZOLAM 1 MG  TABS Take 1 tab every 8 hours.   Supply Requested: 1 month   Last Refilled: 11/14/2010   Notes: Patient will be out of medication today [facility]     Method Requested: Telephone to Pharmacy Initial call taken by: Benny Lennert CMA Duncan Dull),  December 12, 2010 12:44 PM  Follow-up for Phone Call        patient has a controlled substance contract with Dr. Ermalene Searing.  denied. Call 1 week ahead of running out. Follow-up by: Hannah Beat MD,  December 12, 2010 1:01 PM  Additional Follow-up for Phone Call Additional follow up Details #1::        Reviewed.Marland Kitchen okay to fill.  Additional Follow-up by: Kerby Nora MD,  December 12, 2010 10:26 PM    Additional Follow-up for Phone Call Additional follow up Details #2::    rx called to pharmacy.Consuello Masse CMA   Follow-up by: Benny Lennert CMA (AAMA),  December 13, 2010 9:20 AM  Prescriptions: ALPRAZOLAM 1 MG  TABS (ALPRAZOLAM) Take 1 tab every 8 hours.  #90 x 0   Entered and Authorized by:   Kerby Nora MD   Signed by:   Kerby Nora MD on 12/12/2010   Method used:   Telephoned to ...       [facility]* (retail)       [address] [place], [place]  [address]       Ph: [phone] or [phone]       Fax: [phone]   RxID:   [id number]

## 2011-01-16 NOTE — Progress Notes (Signed)
Summary: alprazolam  Phone Note Refill Request Call back at Home Phone [phone] Message from:  Patient on January 10, 2011 10:02 AM  Refills Requested: Medication #1:  ALPRAZOLAM 1 MG  TABS Take 1 tab every 8 hours. [facility]. Patient has one pill left.   Initial call taken by: Melody Comas,  January 10, 2011 10:03 AM  Follow-up for Phone Call        Rx called to pharmacy Follow-up by: Benny Lennert CMA Duncan Dull),  January 11, 2011 11:26 AM    Prescriptions: ALPRAZOLAM 1 MG  TABS (ALPRAZOLAM) Take 1 tab every 8 hours.  #90 x 0   Entered and Authorized by:   Kerby Nora MD   Signed by:   Kerby Nora MD on 01/11/2011   Method used:   Telephoned to ...       [facility] (retail)       [address] [place], [place]  [address]       Ph: [phone] or [phone]       Fax: [phone]   RxID:   [id number]

## 2011-02-06 ENCOUNTER — Telehealth: Payer: Self-pay | Admitting: Family Medicine

## 2011-02-14 NOTE — Progress Notes (Signed)
Summary: alprazolam  Phone Note Refill Request Message from:  Patient on February 06, 2011 2:40 PM  Refills Requested: Medication #1:  ALPRAZOLAM 1 MG  TABS Take 1 tab every 8 hours. Patient is going to be out of meds tomorrow. [facility].  Initial call taken by: Melody Comas,  February 06, 2011 2:40 PM  Follow-up for Phone Call        Patient called back today stating that he is now out of medication. He is asking if it can be filled today.  Follow-up by: Melody Comas,  February 07, 2011 10:45 AM  Additional Follow-up for Phone Call Additional follow up Details #1::        Patient now calling asking what kind of withdraw effects he can expect because he is out of mediction. This is the first time in 3 years that patient has been completely out of medication. Please advise? Additional Follow-up by: Benny Lennert CMA Duncan Dull),  February 07, 2011 3:58 PM    Additional Follow-up for Phone Call Additional follow up Details #2::    Filled 01/10/2011. This is a little bit too early for his refill.  I would expect he will feel anxious, may have some difficulty sleeping, may have a slight tremor.   I will alert Dr. Ermalene Searing, but it is a little too early for him to  be out of his medication.  cc: Dr. Leonard Schwartz. Follow-up by: Hannah Beat MD,  February 07, 2011 4:22 PM  Additional Follow-up for Phone Call Additional follow up Details #3:: Details for Additional Follow-up Action Taken: Patient advised.Consuello Masse CMA    rx called to pharamcy Additional Follow-up by: Benny Lennert CMA Duncan Dull),  February 07, 2011 4:25 PM  Prescriptions: ALPRAZOLAM 1 MG  TABS (ALPRAZOLAM) Take 1 tab every 8 hours.  #90 x 0   Entered and Authorized by:   Kerby Nora MD   Signed by:   Kerby Nora MD on 02/08/2011   Method used:   Telephoned to ...       [facility] (retail)       [address] [place], [place]  [address]  Ph: [phone] or [phone]       Fax: [phone]   RxID:   [id number]

## 2011-03-07 ENCOUNTER — Other Ambulatory Visit: Payer: Self-pay | Admitting: *Deleted

## 2011-03-07 MED ORDER — ALPRAZOLAM 1 MG PO TABS: 1.0000 mg | ORAL_TABLET | Freq: Three times a day (TID) | ORAL | Status: DC | PRN

## 2011-03-07 NOTE — Telephone Encounter (Signed)
Rx called to pharmacy

## 2011-04-01 ENCOUNTER — Other Ambulatory Visit: Payer: Self-pay | Admitting: *Deleted

## 2011-04-01 MED ORDER — ALPRAZOLAM 1 MG PO TABS: 1.0000 mg | ORAL_TABLET | Freq: Three times a day (TID) | ORAL | Status: DC | PRN

## 2011-04-03 NOTE — Telephone Encounter (Signed)
Rx phoned to pharmacy.  

## 2011-04-30 ENCOUNTER — Other Ambulatory Visit: Payer: Self-pay | Admitting: *Deleted

## 2011-04-30 MED ORDER — ALPRAZOLAM 1 MG PO TABS: 1.0000 mg | ORAL_TABLET | Freq: Three times a day (TID) | ORAL | Status: DC | PRN

## 2011-05-01 NOTE — Telephone Encounter (Signed)
rx called to pharmacy 

## 2011-05-29 ENCOUNTER — Other Ambulatory Visit: Payer: Self-pay | Admitting: *Deleted

## 2011-05-29 MED ORDER — ALPRAZOLAM 1 MG PO TABS: 1.0000 mg | ORAL_TABLET | Freq: Three times a day (TID) | ORAL | Status: DC | PRN

## 2011-05-29 NOTE — Telephone Encounter (Signed)
Script call to pharmacy and given to [facility]

## 2011-05-29 NOTE — Telephone Encounter (Signed)
Please call in plain Xanax 1 mg, 1 po tid prn anxiety, #90, 0 refills  Filled in Dr. Daphine Deutscher absence while she is on vacation.

## 2011-06-03 ENCOUNTER — Emergency Department (HOSPITAL_COMMUNITY)
Admission: EM | Admit: 2011-06-03 | Discharge: 2011-06-04 | Disposition: A | Discharge status: Home / Self Care | Payer: Self-pay | Attending: Emergency Medicine | Admitting: Emergency Medicine

## 2011-06-03 DIAGNOSIS — R509 Fever, unspecified: Secondary | ICD-10-CM | POA: Insufficient documentation

## 2011-06-03 DIAGNOSIS — H9209 Otalgia, unspecified ear: Secondary | ICD-10-CM | POA: Insufficient documentation

## 2011-06-03 DIAGNOSIS — R51 Headache: Secondary | ICD-10-CM | POA: Insufficient documentation

## 2011-06-07 ENCOUNTER — Encounter: Payer: Self-pay | Admitting: Family Medicine

## 2011-06-07 ENCOUNTER — Ambulatory Visit (INDEPENDENT_AMBULATORY_CARE_PROVIDER_SITE_OTHER): Payer: Self-pay | Admitting: Family Medicine

## 2011-06-07 ENCOUNTER — Ambulatory Visit (INDEPENDENT_AMBULATORY_CARE_PROVIDER_SITE_OTHER)
Admission: RE | Admit: 2011-06-07 | Discharge: 2011-06-07 | Disposition: A | Discharge status: Home / Self Care | Payer: Self-pay | Source: Ambulatory Visit | Attending: Family Medicine | Admitting: Family Medicine

## 2011-06-07 ENCOUNTER — Encounter: Payer: Self-pay | Admitting: *Deleted

## 2011-06-07 VITALS — BP 120/70 | HR 80 | Temp 98.5°F | Wt 154.8 lb

## 2011-06-07 DIAGNOSIS — R059 Cough, unspecified: Secondary | ICD-10-CM | POA: Insufficient documentation

## 2011-06-07 DIAGNOSIS — E860 Dehydration: Secondary | ICD-10-CM | POA: Insufficient documentation

## 2011-06-07 DIAGNOSIS — R05 Cough: Secondary | ICD-10-CM

## 2011-06-07 NOTE — Progress Notes (Signed)
Subjective:    Patient ID: Frederick Young, male    DOB: [birth year], 29 y.o.   MRN: [id number]  HPI Comments: Micah Flesher to ER Monday night. Given tylenol. Told it was a virus. No tests done at that time.     Headache  Associated symptoms include coughing, a fever and weakness. Pertinent negatives include no ear pain, neck pain, sinus pressure or sore throat.  Fever  Associated symptoms include congestion, coughing and headaches. Pertinent negatives include no chest pain, diarrhea, ear pain, sore throat or wheezing.  Altered Mental Status Associated symptoms include congestion, coughing, a fever, headaches and weakness. Pertinent negatives include no chest pain, neck pain or sore throat.  Cough This is a new problem. The current episode started in the past 7 days (sudden onset, felt hit by freight train). The problem has been gradually worsening. The cough is non-productive (smoker). Associated symptoms include a fever, headaches, nasal congestion and shortness of breath. Pertinent negatives include no chest pain, ear congestion, ear pain, sore throat or wheezing. Associated symptoms comments: Dizziness, confusion, tightness in chest, cough developed yesterday.  body ache, neck was stiff early on.. Treatments tried: Tylenol ibuprofen. The treatment provided mild relief.      Review of Systems  Constitutional: Positive for fever.  HENT: Positive for congestion. Negative for ear pain, sore throat, drooling, mouth sores, trouble swallowing, neck pain, neck stiffness, voice change and sinus pressure.   Respiratory: Positive for cough and shortness of breath. Negative for wheezing.        Has noted some tightness in chest, mild.  Cardiovascular: Negative for chest pain.  Gastrointestinal: Negative for diarrhea, constipation and abdominal distention.  Genitourinary: Negative for dysuria.       HAs been trying to drink fluids, but only drinking about 8 oz a day... Early on drinking  minimally. Minimal eating.   Neurological: Positive for weakness and headaches.       Feeling off balance in past few day.  Headache all over head.  Psychiatric/Behavioral: Positive for altered mental status.       Objective:   Physical Exam  Constitutional: He is oriented to person, place, and time. Vital signs are normal. He appears well-developed and well-nourished.  Non-toxic appearance. He does not appear ill. No distress.       Eyes red rimmed, fatigued appearing.  HENT:  Head: Normocephalic and atraumatic.  Right Ear: Hearing, tympanic membrane, external ear and ear canal normal. No tenderness. No foreign bodies. Tympanic membrane is not retracted and not bulging.  Left Ear: Hearing, tympanic membrane, external ear and ear canal normal. No tenderness. No foreign bodies. Tympanic membrane is not retracted and not bulging.  Nose: Nose normal. No mucosal edema or rhinorrhea. Right sinus exhibits no maxillary sinus tenderness and no frontal sinus tenderness. Left sinus exhibits no maxillary sinus tenderness and no frontal sinus tenderness.  Mouth/Throat: Uvula is midline, oropharynx is clear and moist and mucous membranes are normal. Normal dentition. No dental caries. No oropharyngeal exudate or tonsillar abscesses.  Eyes: Conjunctivae, EOM and lids are normal. Pupils are equal, round, and reactive to light. No foreign bodies found.  Neck: Trachea normal, normal range of motion and phonation normal. Neck supple. Carotid bruit is not present. No mass and no thyromegaly present.  Cardiovascular: Normal rate, regular rhythm, S1 normal, S2 normal, normal heart sounds, intact distal pulses and normal pulses.  Exam reveals no gallop.   No murmur heard. Pulmonary/Chest: Effort normal and breath sounds normal. No respiratory distress.  He has no wheezes. He has no rhonchi. He has no rales.  Abdominal: Soft. Normal appearance and bowel sounds are normal. There is no hepatosplenomegaly. There is no  tenderness. There is no rebound, no guarding and no CVA tenderness. No hernia.  Neurological: He is alert and oriented to person, place, and time. He has normal strength and normal reflexes. He displays normal reflexes. No cranial nerve deficit or sensory deficit. He exhibits normal muscle tone. He displays a negative Romberg sign. Coordination and gait normal. GCS eye subscore is 4. GCS verbal subscore is 5. GCS motor subscore is 6.  Skin: Skin is warm, dry and intact. No rash noted.  Psychiatric: He has a normal mood and affect. His speech is normal and behavior is normal. Judgment normal.          Assessment & Plan:

## 2011-06-07 NOTE — Patient Instructions (Signed)
We will call you with CXR results.  Push fluids, try to get back to normal diet. Rest.  No outdoor work in heat.

## 2011-06-11 ENCOUNTER — Encounter: Payer: Self-pay | Admitting: *Deleted

## 2011-06-11 ENCOUNTER — Encounter: Payer: Self-pay | Admitting: Family Medicine

## 2011-06-11 ENCOUNTER — Ambulatory Visit (INDEPENDENT_AMBULATORY_CARE_PROVIDER_SITE_OTHER): Payer: Self-pay | Admitting: Family Medicine

## 2011-06-11 DIAGNOSIS — R05 Cough: Secondary | ICD-10-CM

## 2011-06-11 DIAGNOSIS — E860 Dehydration: Secondary | ICD-10-CM

## 2011-06-11 NOTE — Assessment & Plan Note (Signed)
Resolved

## 2011-06-11 NOTE — Progress Notes (Signed)
  Subjective:    Patient ID: Frederick Young, male    DOB: [birth year], 30 y.o.   MRN: [id number]  HPI  30 year old male seen at ER 1 week ago then 4 days ago with likely viral syndrom causing fever, cough, dehydration. CXR was clear 4 days ago. Over the weekend...he reports he has been gradually improving.  no fever in last 48 hours off tylenol completely. Still coughing, somewhat worse. Mild shortness of breath.  Continued improvement in dizziness, balance issues. No muscle ache and pain.  Has been trying to rehydrate as much as possible.   Review of Systems  Constitutional: Positive for fatigue. Negative for fever.  HENT: Positive for congestion. Negative for ear pain, sore throat and sinus pressure.   Eyes: Negative for pain.  Respiratory: Positive for cough. Negative for shortness of breath and wheezing.   Cardiovascular: Negative for chest pain and leg swelling.  Gastrointestinal: Negative for abdominal pain, diarrhea and constipation.       Objective:   Physical Exam  Constitutional: Vital signs are normal. He appears well-developed and well-nourished.  Non-toxic appearance. He does not appear ill. No distress.  HENT:  Head: Normocephalic and atraumatic.  Right Ear: Hearing, tympanic membrane, external ear and ear canal normal. No tenderness. No foreign bodies. Tympanic membrane is not retracted and not bulging.  Left Ear: Hearing, tympanic membrane, external ear and ear canal normal. No tenderness. No foreign bodies. Tympanic membrane is not retracted and not bulging.  Nose: Nose normal. No mucosal edema or rhinorrhea. Right sinus exhibits no maxillary sinus tenderness and no frontal sinus tenderness. Left sinus exhibits no maxillary sinus tenderness and no frontal sinus tenderness.  Mouth/Throat: Uvula is midline, oropharynx is clear and moist and mucous membranes are normal. Normal dentition. No dental caries. No oropharyngeal exudate or tonsillar abscesses.  Eyes:  Conjunctivae, EOM and lids are normal. Pupils are equal, round, and reactive to light. No foreign bodies found.  Neck: Trachea normal, normal range of motion and phonation normal. Neck supple. Carotid bruit is not present. No mass and no thyromegaly present.  Cardiovascular: Normal rate, regular rhythm, S1 normal, S2 normal, normal heart sounds, intact distal pulses and normal pulses.  Exam reveals no gallop.   No murmur heard. Pulmonary/Chest: Effort normal and breath sounds normal. No respiratory distress. He has no wheezes. He has no rhonchi. He has no rales.  Abdominal: Soft. Normal appearance and bowel sounds are normal. There is no hepatosplenomegaly. There is no tenderness. There is no rebound, no guarding and no CVA tenderness. No hernia.  Neurological: He is alert. He has normal reflexes.  Skin: Skin is warm, dry and intact. No rash noted.  Psychiatric: He has a normal mood and affect. His speech is normal and behavior is normal. Judgment normal.          Assessment & Plan:

## 2011-06-11 NOTE — Assessment & Plan Note (Signed)
Likely due to viral syndrome. Gradually improving with time. Continue conservative care. Follow up if not continuing to improve.

## 2011-06-24 ENCOUNTER — Other Ambulatory Visit: Payer: Self-pay | Admitting: *Deleted

## 2011-06-24 NOTE — Telephone Encounter (Signed)
This is due on Friday, but patient is requesting early since you are out of the office wed and Thursday.

## 2011-06-25 MED ORDER — ALPRAZOLAM 1 MG PO TABS: 1.0000 mg | ORAL_TABLET | Freq: Three times a day (TID) | ORAL | Status: DC | PRN

## 2011-06-25 NOTE — Telephone Encounter (Signed)
rx called to Frederick Young at [facility]

## 2011-07-23 ENCOUNTER — Other Ambulatory Visit: Payer: Self-pay | Admitting: *Deleted

## 2011-07-23 MED ORDER — ALPRAZOLAM 1 MG PO TABS: 1.0000 mg | ORAL_TABLET | Freq: Three times a day (TID) | ORAL | Status: DC | PRN

## 2011-07-23 NOTE — Telephone Encounter (Signed)
rx called to pharmacy [facility])

## 2011-08-19 ENCOUNTER — Other Ambulatory Visit: Payer: Self-pay | Admitting: *Deleted

## 2011-08-21 MED ORDER — ALPRAZOLAM 1 MG PO TABS: 1.0000 mg | ORAL_TABLET | Freq: Three times a day (TID) | ORAL | Status: DC | PRN

## 2011-08-21 NOTE — Telephone Encounter (Signed)
Overdue for CPX.Marland Kitchen No refills until appt scheduled.

## 2011-08-21 NOTE — Telephone Encounter (Signed)
rx called to pharmacy and they will put in directions no further refills until physical scheduled

## 2011-09-16 ENCOUNTER — Other Ambulatory Visit: Payer: Self-pay | Admitting: *Deleted

## 2011-09-16 MED ORDER — ALPRAZOLAM 1 MG PO TABS: 1.0000 mg | ORAL_TABLET | Freq: Three times a day (TID) | ORAL | Status: DC | PRN

## 2011-09-16 NOTE — Telephone Encounter (Signed)
rx called to pharmacy 

## 2011-09-16 NOTE — Telephone Encounter (Signed)
Last refill... 9/10.. Only 2 days early Okay to refill .

## 2011-09-16 NOTE — Telephone Encounter (Signed)
Patients mother says he has appt on Thursday but, will be out of medication before then and he has been under alot of stress lately she know its a few days early but, is it okay to refill  Now?

## 2011-09-19 ENCOUNTER — Ambulatory Visit: Payer: Self-pay | Admitting: Family Medicine

## 2011-09-19 LAB — DIFFERENTIAL
Basophils Absolute: 0
Basophils Relative: 0
Eosinophils Absolute: 0
Eosinophils Relative: 1
Lymphocytes Relative: 32
Lymphs Abs: 2.9
Monocytes Absolute: 0.6
Monocytes Relative: 7
Neutro Abs: 5.5
Neutrophils Relative %: 60

## 2011-09-19 LAB — POCT I-STAT CREATININE
Creatinine, Ser: 1.1
Operator id: 270111

## 2011-09-19 LAB — CBC
HCT: 44.8
MCV: 88
RBC: 5.08
WBC: 9.1

## 2011-09-19 LAB — I-STAT 8, (EC8 V) (CONVERTED LAB)
Acid-Base Excess: 4 — ABNORMAL HIGH
Glucose, Bld: 96
HCT: 47
Hemoglobin: 16
Operator id: 270111
Potassium: 3.9
Sodium: 139
TCO2: 32

## 2011-09-19 LAB — D-DIMER, QUANTITATIVE: D-Dimer, Quant: <0.22

## 2011-09-19 LAB — POCT CARDIAC MARKERS: CKMB, poc: <1 — ABNORMAL LOW

## 2011-09-20 LAB — COMPREHENSIVE METABOLIC PANEL
BUN: 14
CO2: 27
Calcium: 9
Creatinine, Ser: 1.16
GFR calc non Af Amer: >60
Glucose, Bld: 100 — ABNORMAL HIGH
Total Protein: 6.4

## 2011-09-20 LAB — CBC
HCT: 43.6
Hemoglobin: 14.9
MCHC: 34.3
MCV: 87
RDW: 11.5

## 2011-09-20 LAB — URINALYSIS, ROUTINE W REFLEX MICROSCOPIC
Glucose, UA: NEGATIVE
Ketones, ur: NEGATIVE
Nitrite: NEGATIVE
Protein, ur: NEGATIVE
pH: 6.5

## 2011-09-20 LAB — DIFFERENTIAL
Basophils Relative: 1
Lymphs Abs: 2.4
Monocytes Relative: 9
Neutro Abs: 4.8
Neutrophils Relative %: 59

## 2011-09-20 LAB — LIPASE, BLOOD: Lipase: 18

## 2011-09-26 ENCOUNTER — Encounter: Payer: Self-pay | Admitting: Family Medicine

## 2011-09-26 ENCOUNTER — Ambulatory Visit (INDEPENDENT_AMBULATORY_CARE_PROVIDER_SITE_OTHER): Payer: Self-pay | Admitting: Family Medicine

## 2011-09-26 DIAGNOSIS — F411 Generalized anxiety disorder: Secondary | ICD-10-CM

## 2011-09-26 DIAGNOSIS — F410 Panic disorder [episodic paroxysmal anxiety] without agoraphobia: Secondary | ICD-10-CM

## 2011-09-26 DIAGNOSIS — E119 Type 2 diabetes mellitus without complications: Secondary | ICD-10-CM

## 2011-09-26 NOTE — Assessment & Plan Note (Signed)
Poor control...recommended psychiatrist, but pt cannot afford given no insurance. We have looked into this in past somewhat ( i will have MArion re-eval his options..  Will refer to counselor to see if this is an option. Work on stress reduction and relaxation. Will give an additional 10 tabs each month, but pt informed that different anxiolytic like citalopram or other would need to be tried next.

## 2011-09-26 NOTE — Patient Instructions (Addendum)
Limit xanax as much as possible. Work on stress reduction and exercsie as ways to help control anxiety. Stop at front to get info on counselor  ( Dr. Laymond Purser) from [place]. We will not increase xanax further... Next step if remains poorly controlled would be trying a different anxiety medication. Follow up in 1 month given poor control of anxiety.

## 2011-09-26 NOTE — Progress Notes (Signed)
  Subjective:    Patient ID: Frederick Young, male    DOB: 1981-03-21, 30 y.o.   MRN: [id number]  HPI  30 year old male with history of anxiety and panic attack on alprazolam  TID presents to clinic today for  Follow up. He reports continued stress.Marland Kitchen He temporarily lost job.Marland Kitchen Has started job back recently. He has had more anxiety and panic in last few months... He has started taking 2 tab po in AM... Few hours later he takes  His third dose... occ taking fourth tab later in day. Running out of medication early. He has been on TID alprazolam for 4-5 years. He does not have insurance so cannot see psychiatrist.  Has tried sertraline,paxil, prozac, buspar in past cause SE or did not help much. Girlfriend on effexor... Tried this but did not help.   He is continuing to work down on methadone, but has not decreased any further in past 80   5 days ago ... Fever, N/V,D... Also some cough and congestion. Symptoms resolved now, no fever in 24 hours. Now continues to feel somewhat weak.   Review of Systems  Constitutional: Negative for fever and fatigue.  HENT: Negative for ear pain.   Eyes: Negative for pain.  Respiratory: Negative for cough and shortness of breath.   Cardiovascular: Negative for chest pain.  Gastrointestinal: Negative for diarrhea and constipation.       Objective:   Physical Exam  Constitutional: Vital signs are normal. He appears well-developed and well-nourished.  HENT:  Head: Normocephalic.  Right Ear: Hearing normal.  Left Ear: Hearing normal.  Nose: Nose normal.  Mouth/Throat: Oropharynx is clear and moist and mucous membranes are normal.  Neck: Trachea normal. Carotid bruit is not present. No mass and no thyromegaly present.  Cardiovascular: Normal rate, regular rhythm and normal pulses.  Exam reveals no gallop, no distant heart sounds and no friction rub.   No murmur heard.      No peripheral edema  Pulmonary/Chest: Effort normal and breath sounds  normal. No respiratory distress.  Skin: Skin is warm, dry and intact. No rash noted.  Psychiatric: His speech is normal. Judgment and thought content normal. His mood appears anxious. He is agitated. He expresses no suicidal plans.          Assessment & Plan:

## 2011-10-10 ENCOUNTER — Other Ambulatory Visit: Payer: Self-pay | Admitting: *Deleted

## 2011-10-10 MED ORDER — ALPRAZOLAM 1 MG PO TABS: 1.0000 mg | ORAL_TABLET | Freq: Three times a day (TID) | ORAL | Status: DC | PRN
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
## 2011-10-10 NOTE — Telephone Encounter (Signed)
Patient advised and rx called to pharmacy  

## 2011-10-24 ENCOUNTER — Telehealth: Payer: Self-pay | Admitting: *Deleted

## 2011-10-24 MED ORDER — CEPHALEXIN 500 MG PO TABS: 500.0000 mg | ORAL_TABLET | Freq: Three times a day (TID) | ORAL | Status: AC

## 2011-10-24 NOTE — Telephone Encounter (Signed)
Patient has sore on foot that is getting worse with redness and spreading. Patient is asking for an antibiotic to be called in b/c he can not miss work. Please send to [facility]

## 2011-10-24 NOTE — Telephone Encounter (Signed)
Will prescribe keflex x 7 days.. If not improving in 48 hours.. make appt to be seen, call if fever, redness spreading on antibiotic.

## 2011-10-24 NOTE — Telephone Encounter (Signed)
Patient advised.

## 2011-11-04 ENCOUNTER — Ambulatory Visit: Payer: Self-pay | Admitting: Family Medicine

## 2011-11-04 ENCOUNTER — Other Ambulatory Visit: Payer: Self-pay | Admitting: *Deleted

## 2011-11-04 MED ORDER — ALPRAZOLAM 1 MG PO TABS: 1.0000 mg | ORAL_TABLET | Freq: Three times a day (TID) | ORAL | Status: DC | PRN
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
## 2011-11-04 NOTE — Telephone Encounter (Signed)
Too early for refill. I understand pt with some increase in stress to recent life events. I have already increase zanax an additional 10 tabs a month.   No refills until closer to 12/1 when due. Okay to refill before follow up, If pt poorly controlled anxiety.. Needs to call  [facility] to be seen with sliding scale payment. Also helpline number is [phone].

## 2011-11-04 NOTE — Telephone Encounter (Signed)
Patient has appt on 12-09-2011 b/c that was the only time you had a 8:15 am appt please advise on refill

## 2011-11-05 NOTE — Telephone Encounter (Signed)
rx called to pharmacy 

## 2011-12-09 ENCOUNTER — Ambulatory Visit: Payer: Self-pay | Admitting: Family Medicine

## 2011-12-09 ENCOUNTER — Other Ambulatory Visit: Payer: Self-pay | Admitting: *Deleted

## 2011-12-09 MED ORDER — ALPRAZOLAM 1 MG PO TABS: 1.0000 mg | ORAL_TABLET | Freq: Three times a day (TID) | ORAL | Status: DC | PRN

## 2011-12-09 NOTE — Telephone Encounter (Signed)
Rx called to pharmacy and patient advised

## 2011-12-17 ENCOUNTER — Ambulatory Visit: Payer: Self-pay | Admitting: Family Medicine

## 2011-12-22 ENCOUNTER — Encounter (HOSPITAL_COMMUNITY): Payer: Self-pay | Admitting: *Deleted

## 2011-12-22 ENCOUNTER — Emergency Department (HOSPITAL_COMMUNITY)
Admission: EM | Admit: 2011-12-22 | Discharge: 2011-12-22 | Disposition: A | Discharge status: Home / Self Care | Payer: Self-pay | Attending: Emergency Medicine | Admitting: Emergency Medicine

## 2011-12-22 ENCOUNTER — Emergency Department (HOSPITAL_COMMUNITY): Payer: Self-pay

## 2011-12-22 ENCOUNTER — Emergency Department (INDEPENDENT_AMBULATORY_CARE_PROVIDER_SITE_OTHER)
Admission: EM | Admit: 2011-12-22 | Discharge: 2011-12-22 | Disposition: A | Discharge status: Nursing Home (Includes ICF) | Payer: Self-pay | Source: Home / Self Care | Attending: Emergency Medicine | Admitting: Emergency Medicine

## 2011-12-22 DIAGNOSIS — S0181XA Laceration without foreign body of other part of head, initial encounter: Secondary | ICD-10-CM

## 2011-12-22 DIAGNOSIS — S0180XA Unspecified open wound of other part of head, initial encounter: Secondary | ICD-10-CM | POA: Insufficient documentation

## 2011-12-22 DIAGNOSIS — S0083XA Contusion of other part of head, initial encounter: Secondary | ICD-10-CM

## 2011-12-22 DIAGNOSIS — S0511XA Contusion of eyeball and orbital tissues, right eye, initial encounter: Secondary | ICD-10-CM

## 2011-12-22 DIAGNOSIS — H538 Other visual disturbances: Secondary | ICD-10-CM | POA: Insufficient documentation

## 2011-12-22 DIAGNOSIS — S0510XA Contusion of eyeball and orbital tissues, unspecified eye, initial encounter: Secondary | ICD-10-CM

## 2011-12-22 MED ORDER — IBUPROFEN 800 MG PO TABS
800.0000 mg | ORAL_TABLET | Freq: Once | ORAL | Status: AC
  Administered 2011-12-22: 800 mg via ORAL
  Filled 2011-12-22: qty 1

## 2011-12-22 MED ORDER — HYDROCODONE-ACETAMINOPHEN 5-325 MG PO TABS: 1.0000 | ORAL_TABLET | ORAL | Status: AC | PRN

## 2011-12-22 MED ORDER — ACETAMINOPHEN 325 MG PO TABS
650.0000 mg | ORAL_TABLET | Freq: Once | ORAL | Status: AC
  Administered 2011-12-22: 650 mg via ORAL
  Filled 2011-12-22: qty 2
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
## 2011-12-22 NOTE — ED Notes (Signed)
Bilateral mild lacerations above eye brows. Controlled bleeding. No bruising. Slight blurred vision both sides. There is swelling below rt. Eye. Drank alcohol x 4- 5 shots, marijuana, smoke cigarettes. No loc. No neck or back pain. Was at urgent care and told to come here for CT of head.
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
## 2011-12-22 NOTE — ED Notes (Signed)
Patient transported to CT 

## 2012-01-04 ENCOUNTER — Encounter (HOSPITAL_COMMUNITY): Payer: Self-pay

## 2012-01-04 ENCOUNTER — Other Ambulatory Visit: Payer: Self-pay

## 2012-01-04 ENCOUNTER — Emergency Department (HOSPITAL_COMMUNITY)
Admission: EM | Admit: 2012-01-04 | Discharge: 2012-01-04 | Disposition: A | Discharge status: Home / Self Care | Payer: Self-pay | Attending: Emergency Medicine | Admitting: Emergency Medicine

## 2012-01-04 DIAGNOSIS — R209 Unspecified disturbances of skin sensation: Secondary | ICD-10-CM | POA: Insufficient documentation

## 2012-01-04 DIAGNOSIS — F1320 Sedative, hypnotic or anxiolytic dependence, uncomplicated: Secondary | ICD-10-CM | POA: Insufficient documentation

## 2012-01-04 DIAGNOSIS — R259 Unspecified abnormal involuntary movements: Secondary | ICD-10-CM | POA: Insufficient documentation

## 2012-01-04 DIAGNOSIS — F19939 Other psychoactive substance use, unspecified with withdrawal, unspecified: Secondary | ICD-10-CM | POA: Insufficient documentation

## 2012-01-04 DIAGNOSIS — E7800 Pure hypercholesterolemia, unspecified: Secondary | ICD-10-CM | POA: Insufficient documentation

## 2012-01-04 DIAGNOSIS — Z79899 Other long term (current) drug therapy: Secondary | ICD-10-CM | POA: Insufficient documentation

## 2012-01-04 HISTORY — DX: Pure hypercholesterolemia, unspecified: E78.00

## 2012-01-04 LAB — BASIC METABOLIC PANEL
Chloride: 99 mEq/L (ref 96–112)
GFR calc Af Amer: >90 mL/min (ref >90)
Potassium: 3.3 mEq/L — ABNORMAL LOW (ref 3.5–5.1)

## 2012-01-04 LAB — CBC
HCT: 47 % (ref 39.0–52.0)
Hemoglobin: 16.5 g/dL (ref 13.0–17.0)
WBC: 8 10*3/uL (ref 4.0–10.5)

## 2012-01-04 MED ORDER — ALPRAZOLAM 0.5 MG PO TABS: 0.5000 mg | ORAL_TABLET | Freq: Every evening | ORAL | Status: DC | PRN

## 2012-01-04 MED ORDER — POTASSIUM CHLORIDE CRYS ER 20 MEQ PO TBCR
40.0000 meq | EXTENDED_RELEASE_TABLET | Freq: Once | ORAL | Status: AC
  Administered 2012-01-04: 40 meq via ORAL
  Filled 2012-01-04: qty 2

## 2012-01-04 MED ORDER — ALPRAZOLAM 0.5 MG PO TABS: 0.5000 mg | ORAL_TABLET | Freq: Every evening | ORAL | Status: AC | PRN

## 2012-01-04 MED ORDER — LORAZEPAM 1 MG PO TABS
1.0000 mg | ORAL_TABLET | Freq: Once | ORAL | Status: AC
  Administered 2012-01-04: 1 mg via ORAL
  Filled 2012-01-04: qty 1

## 2012-01-04 NOTE — ED Provider Notes (Signed)
History     CSN: [id number]  Arrival date & time 01/04/12  0347   First MD Initiated Contact with Patient 01/04/12 0359      Chief Complaint  Patient presents with  . Numbness    (Consider location/radiation/quality/duration/timing/severity/associated sxs/prior treatment) HPI Comments: 31 year old male with a history of anxiety, substance abuse who presents with a complaint of gradual onset of numbness and tingling in both of his hands and both of his feet. The patient states that he has had to take 3-4 mg of Xanax every day for the last 4 years. He ran out of his medications approximately 48 hours ago and has not had it refilled. 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 Patient states that approximately 24 hours ago he awoke with a feeling of tingling and numbness in his bilateral fingertips. The symptoms persisted throughout the day and became slightly worse. At nighttime he felt like his mind was racing, he could not get comfortable and felt very agitated. This morning he felt like his toes and feet were also tingling and had a slightly numb feeling area and the agitation is increased, the mind racing has also increased. He denies auditory or visual hallucinations, has no depression or suicidal thoughts. The patient denies fevers, chills, nausea, vomiting, shortness of breath, cough, chest pain, back pain, swelling, rashes.  The symptoms are persistent, gradually getting worse  The history is provided by the patient and medical records.    Past Medical History  Diagnosis Date  . Anxiety   . Substance abuse   . Heart palpitations   . Hypercholesteremia     No past surgical history on file.  Family History  Problem Relation Age of Onset  . Anxiety disorder Mother   . Stroke Paternal Grandmother   . Cancer Paternal Grandmother     History  Substance Use Topics  . Smoking status: Current Everyday Smoker -- 1.0 packs/day for 16 years  . Smokeless  tobacco: Not on file  . Alcohol Use: No     history of alcohol abuse      Review of Systems  All other systems reviewed and are negative.    Allergies  Review of patient's allergies indicates no known allergies.  Home Medications   Current Outpatient Rx  Name Route Sig Dispense Refill  . ALPRAZOLAM 1 MG PO TABS Oral Take 1-2 mg by mouth 4 (four) times daily. Anxiety and sleep    . METHADONE HCL 40 MG PO TBSO Oral Take 60 mg by mouth daily. Usually takes every other day    . ALPRAZOLAM 0.5 MG PO TABS Oral Take 1 tablet (0.5 mg total) by mouth at bedtime as needed for sleep. 20 tablet 0  . EPINEPHRINE 0.3 MG/0.3ML IJ DEVI Intramuscular Inject 0.3 mg into the muscle once as needed. If stung by bee      BP 130/85  Pulse 80  Temp(Src) 98.2 F (36.8 C) (Oral)  Resp 14  Ht 5\' 8"  (1.727 m)  Wt 160 lb (72.576 kg)  BMI 24.33 kg/m2  SpO2 100%  Physical Exam  Nursing note and vitals reviewed. Constitutional: He appears well-developed and well-nourished. No distress.  HENT:  Head: Normocephalic and atraumatic.  Mouth/Throat: Oropharynx is clear and moist. No oropharyngeal exudate.  Eyes: Conjunctivae and EOM are normal. Pupils are equal, round, and reactive to light. Right eye exhibits no discharge. Left eye exhibits no discharge. No scleral icterus.  Neck: Normal range of motion.  Neck supple. No JVD present. No thyromegaly present.  Cardiovascular: Normal rate, regular rhythm, normal heart sounds and intact distal pulses.  Exam reveals no gallop and no friction rub.   No murmur heard. Pulmonary/Chest: Effort normal and breath sounds normal. No respiratory distress. He has no wheezes. He has no rales.  Abdominal: Soft. Bowel sounds are normal. He exhibits no distension and no mass. There is no tenderness.  Musculoskeletal: Normal range of motion. He exhibits no edema and no tenderness.  Lymphadenopathy:    He has no cervical adenopathy.  Neurological: He is alert. Coordination  normal.       Slight tremor, follows commands without difficulty, speech is clear, pupillary exam and extraocular movements are normal, cranial nerves III through XII intact , grips are equal bilaterall sensation is decreased to bilateral hands and bilateral feet at the level of the ankles down, in stocking glove distribution. Follows commands without difficulty, no focal weakness, reflexes are normal at the biceps, brachial radialis, patellar tendons bilaterally  Skin: Skin is warm and dry. No rash noted. No erythema.  Psychiatric: He has a normal mood and affect. His behavior is normal.    ED Course  Procedures (including critical care time)  Labs Reviewed  BASIC METABOLIC PANEL - Abnormal; Notable for the following:    Potassium 3.3 (*)    Glucose, Bld 104 (*)    All other components within normal limits  CBC   No results found.   1. Withdrawal syndrome       MDM  Overall the patient is well-appearing with what appears to be benzodiazepine withdrawal. He does have a history of substance abuse but denies any substance abuse recently. He also denies abusing medications. We'll check an basic metabolic panel to rule out electrolyte disturbance, CBC to rule out anemia, EKG done by nursing on arrival prior to initiation of patient's visit shows no acute findings. Ativan by mouth given for what is likely benzodiazepine withdrawal. Vital signs at this time are normal including a temperature of 98.2 pulse of 82, respirations of 16 and a blood pressure of 134/92, oxygen saturation of 99% on room air  ED ECG REPORT   Date: 01/04/2012 0349  Rate: 74  Rhythm: normal sinus rhythm  QRS Axis: normal  Intervals: normal  ST/T Wave abnormalities: normal  Conduction Disutrbances:none  Narrative Interpretation:   Old EKG Reviewed: none available    K slightly low, otherwise pt is well appaering, ativan given, small Rx of Xanax given with instrctions to f/u closely with PMD and cuation re: cold  [place] from benzos.  Vida Roller, MD 01/04/12 [phone]

## 2012-01-04 NOTE — ED Notes (Signed)
Patient is AOx4 and comfortable with all discharge instructions.

## 2012-01-04 NOTE — ED Notes (Signed)
The patient states he has severe numbness and tingling in his lower extremities and mild numbness and tingling in his upper extremities.

## 2012-01-07 ENCOUNTER — Other Ambulatory Visit: Payer: Self-pay | Admitting: *Deleted

## 2012-01-07 MED ORDER — ALPRAZOLAM 1 MG PO TABS: ORAL_TABLET | ORAL | Status: DC

## 2012-01-07 NOTE — Telephone Encounter (Signed)
Patient advised and rx faxed to pharmacy  

## 2012-02-05 ENCOUNTER — Other Ambulatory Visit: Payer: Self-pay | Admitting: *Deleted

## 2012-02-06 MED ORDER — ALPRAZOLAM 1 MG PO TABS: ORAL_TABLET | ORAL | Status: DC

## 2012-02-06 NOTE — Telephone Encounter (Signed)
Rx called to pharmacy

## 2012-03-02 ENCOUNTER — Other Ambulatory Visit: Payer: Self-pay | Admitting: *Deleted

## 2012-03-03 MED ORDER — ALPRAZOLAM 1 MG PO TABS: ORAL_TABLET | ORAL | Status: DC

## 2012-03-03 NOTE — Telephone Encounter (Signed)
rx called to pharmacy 

## 2012-04-01 ENCOUNTER — Other Ambulatory Visit: Payer: Self-pay | Admitting: *Deleted

## 2012-04-01 MED ORDER — ALPRAZOLAM 1 MG PO TABS: ORAL_TABLET | ORAL | Status: DC

## 2012-04-02 NOTE — Telephone Encounter (Signed)
Rx called to [facility]im[facility] Needle at the pharmacy, patient's mom advised that Rx was called in.

## 2012-04-28 ENCOUNTER — Other Ambulatory Visit: Payer: Self-pay | Admitting: *Deleted

## 2012-04-29 ENCOUNTER — Other Ambulatory Visit: Payer: Self-pay | Admitting: *Deleted

## 2012-04-30 MED ORDER — ALPRAZOLAM 1 MG PO TABS: ORAL_TABLET | ORAL | Status: DC

## 2012-04-30 NOTE — Telephone Encounter (Signed)
rx called to pharmacy 

## 2012-05-28 ENCOUNTER — Other Ambulatory Visit: Payer: Self-pay | Admitting: *Deleted

## 2012-05-28 MED ORDER — ALPRAZOLAM 1 MG PO TABS: ORAL_TABLET | ORAL | Status: DC

## 2012-05-28 NOTE — Telephone Encounter (Signed)
Patient advised and rx called in. 

## 2012-06-25 ENCOUNTER — Other Ambulatory Visit: Payer: Self-pay

## 2012-06-25 MED ORDER — ALPRAZOLAM 1 MG PO TABS: ORAL_TABLET | ORAL | Status: DC

## 2012-06-25 NOTE — Telephone Encounter (Signed)
Pt request refill alprazolam to [facility].Please advise.  

## 2012-06-25 NOTE — Telephone Encounter (Signed)
Medication phoned to pharmacy. Patient advised.  

## 2012-07-24 ENCOUNTER — Other Ambulatory Visit: Payer: Self-pay

## 2012-07-24 MED ORDER — ALPRAZOLAM 1 MG PO TABS: ORAL_TABLET | ORAL | Status: DC

## 2012-07-24 NOTE — Telephone Encounter (Signed)
Medication phoned to [facility] pharmacy as instructed spoke with [place].Jacki Cones notified med called in.

## 2012-07-24 NOTE — Telephone Encounter (Signed)
Please call in rx as Jacki Cones is covering for Herbert Seta and she is this pt's mother.

## 2012-07-24 NOTE — Telephone Encounter (Signed)
Ps mother Jacki Cones request refill alprazolam to [facility]. Advise Jacki Cones when med called in.

## 2012-08-20 ENCOUNTER — Other Ambulatory Visit: Payer: Self-pay

## 2012-08-20 MED ORDER — ALPRAZOLAM 1 MG PO TABS: ORAL_TABLET | ORAL | Status: DC

## 2012-08-20 NOTE — Telephone Encounter (Signed)
Xanax 1 mg #100 called in to [facility].

## 2012-08-20 NOTE — Telephone Encounter (Signed)
pts mother,Laurie request refill alprazolam to [facility].Please advise.

## 2012-09-15 ENCOUNTER — Other Ambulatory Visit: Payer: Self-pay

## 2012-09-15 MED ORDER — ALPRAZOLAM 1 MG PO TABS: ORAL_TABLET | ORAL | Status: DC

## 2012-09-15 NOTE — Telephone Encounter (Signed)
Rx called to pharmacy. Patient's mom notified.

## 2012-09-15 NOTE — Telephone Encounter (Signed)
Frederick Young pts mother request refill alprazolam to [facility].Please advise.

## 2012-10-13 ENCOUNTER — Other Ambulatory Visit: Payer: Self-pay

## 2012-10-13 MED ORDER — ALPRAZOLAM 1 MG PO TABS: ORAL_TABLET | ORAL | Status: DC

## 2012-10-13 NOTE — Telephone Encounter (Signed)
Jacki Cones request refill alprazolam to [facility].Please advise.

## 2012-10-13 NOTE — Telephone Encounter (Signed)
Rx called to pharmacy. Jacki Cones advised.

## 2012-11-10 ENCOUNTER — Other Ambulatory Visit: Payer: Self-pay | Admitting: *Deleted

## 2012-11-10 MED ORDER — ALPRAZOLAM 1 MG PO TABS: ORAL_TABLET | ORAL | Status: DC

## 2012-11-10 NOTE — Telephone Encounter (Signed)
Will need CPX schedule at least by 12/2012 for further refills.

## 2012-11-10 NOTE — Telephone Encounter (Signed)
rx called to pharmacy and patient advised

## 2012-12-08 ENCOUNTER — Other Ambulatory Visit: Payer: Self-pay

## 2012-12-08 MED ORDER — ALPRAZOLAM 1 MG PO TABS: ORAL_TABLET | ORAL | Status: DC

## 2012-12-08 NOTE — Telephone Encounter (Signed)
Pt's mother request refill alprazolam to [facility]; pt cannot come in for appt now due to finances and transportation.Please advise.

## 2012-12-08 NOTE — Telephone Encounter (Signed)
Refill called in to [facility].

## 2013-01-04 ENCOUNTER — Other Ambulatory Visit: Payer: Self-pay | Admitting: *Deleted

## 2013-01-04 MED ORDER — ALPRAZOLAM 1 MG PO TABS: ORAL_TABLET | ORAL | Status: DC

## 2013-01-04 NOTE — Telephone Encounter (Signed)
rx called to pharmacy 

## 2013-01-04 NOTE — Telephone Encounter (Signed)
Ok to refill #100, 0 refills  Due to Dr. Leonard Schwartz illness

## 2013-01-20 ENCOUNTER — Telehealth: Payer: Self-pay | Admitting: *Deleted

## 2013-01-20 NOTE — Telephone Encounter (Signed)
Patient is going to [place] to stay for a while. Patient asking if he can have a script for his xanax to give him to take with him. Please advise.

## 2013-01-21 NOTE — Telephone Encounter (Signed)
When we return to the office I will write a prescription for him to fill on 2/27 when he is due for him to take with him.

## 2013-01-25 NOTE — Telephone Encounter (Signed)
Patient advised.

## 2013-01-26 MED ORDER — ALPRAZOLAM 1 MG PO TABS: ORAL_TABLET | ORAL | Status: DC

## 2013-01-26 NOTE — Telephone Encounter (Signed)
in out box

## 2013-01-27 NOTE — Telephone Encounter (Signed)
Patient given rx.

## 2013-03-02 ENCOUNTER — Other Ambulatory Visit: Payer: Self-pay | Admitting: *Deleted

## 2013-03-02 ENCOUNTER — Other Ambulatory Visit: Payer: Self-pay | Admitting: Family Medicine

## 2013-03-02 MED ORDER — ALPRAZOLAM 1 MG PO TABS: ORAL_TABLET | ORAL | Status: DC

## 2013-03-02 NOTE — Telephone Encounter (Signed)
Patients mother advised and rx called to pharmacy

## 2013-03-02 NOTE — Telephone Encounter (Signed)
Okay.. Make sure she and he know no refills past this point unless he has appt to be seen.

## 2013-03-02 NOTE — Telephone Encounter (Signed)
Spoke with patients mother and he is in [place] and when he comes home she will make sure he comes in to see you

## 2013-03-02 NOTE — Telephone Encounter (Signed)
Needs appt before any more refills.. Once scheduled refill until then.

## 2013-03-29 ENCOUNTER — Other Ambulatory Visit: Payer: Self-pay | Admitting: *Deleted

## 2013-03-29 MED ORDER — ALPRAZOLAM 1 MG PO TABS: ORAL_TABLET | ORAL | Status: DC

## 2013-03-29 NOTE — Telephone Encounter (Signed)
Patient is completely out of medication and mother is trying to get him home as soon as possible can you atleast give him 30 until she can get him home. Patient will have to be on a bus 3 days. Patient also has been off of his methadone for 5 days and really needs this medication ASAP.

## 2013-03-29 NOTE — Telephone Encounter (Signed)
Ok to refill #30, 0 refills  I am uncomfortable calling in #100 to [place].  Hannah Beat, MD 03/29/2013, 4:34 PM

## 2013-03-29 NOTE — Telephone Encounter (Signed)
rx called to pharmacy for #30.   

## 2013-03-29 NOTE — Telephone Encounter (Signed)
pts mother requested verification that alprazolam rx was called to [facility]. I called spoke with [facility] pharmacist who said did not get alprazolam rx. Medication phoned to [facility] as instructed.

## 2013-03-29 NOTE — Telephone Encounter (Signed)
Patient mother request refill for patient because he is still in [place] and she is currently in process for trying to get him back home

## 2013-03-30 NOTE — Telephone Encounter (Addendum)
Make sure he knows that it has now been over a year since he has been seen last at our office 09/2011. We cannot continue to prescribe this unless he makes an appt to be seen as soon as possible. I know he is trying to get back to [place].

## 2013-04-06 ENCOUNTER — Encounter: Payer: Self-pay | Admitting: Family Medicine

## 2013-04-06 ENCOUNTER — Ambulatory Visit (INDEPENDENT_AMBULATORY_CARE_PROVIDER_SITE_OTHER): Payer: Self-pay | Admitting: Family Medicine

## 2013-04-06 VITALS — BP 110/72 | HR 77 | Temp 97.8°F | Ht 68.0 in

## 2013-04-06 DIAGNOSIS — R7309 Other abnormal glucose: Secondary | ICD-10-CM

## 2013-04-06 DIAGNOSIS — F410 Panic disorder [episodic paroxysmal anxiety] without agoraphobia: Secondary | ICD-10-CM

## 2013-04-06 DIAGNOSIS — F411 Generalized anxiety disorder: Secondary | ICD-10-CM

## 2013-04-06 DIAGNOSIS — F1910 Other psychoactive substance abuse, uncomplicated: Secondary | ICD-10-CM

## 2013-04-06 MED ORDER — ALPRAZOLAM 1 MG PO TABS: ORAL_TABLET | ORAL | Status: DC

## 2013-04-06 NOTE — Assessment & Plan Note (Signed)
Due for re-eval. 

## 2013-04-06 NOTE — Assessment & Plan Note (Signed)
Poor control given recetn increase in stress. Recommended family counseling and stress reduction. Refilled Rx for alprazolam. Encouraged exercise, weight loss, healthy eating habits.

## 2013-04-06 NOTE — Patient Instructions (Addendum)
Make appt in next few months for CPX with fasting labs prior.  Consider family counseling.Stop at front desk to get info on family counseling.  Consider writing down you concerns

## 2013-04-06 NOTE — Progress Notes (Signed)
  Subjective:    Patient ID: Frederick Young, male    DOB: 03/26/1981, 32 y.o.   MRN: [id number]  HPI  32 year old male presents for overdue follow up to re-evaluate anxiety. He has been on alprazolam for a long time. He recently moved back from [place].  He is overdue for CPX and labs.  He has had been doing well in [place] after time. Family is very stressed out.. Stressing him out ore in last few days.  He fells weak, cannot breath with panic at times. He is out of  Alprazolam today.  NO SI, o HI.  Has lost a lot of weight.. Has not been eating much given finances. Today weighs 130 or so. Wt Readings from Last 3 Encounters:  01/04/12 160 lb (72.576 kg)  09/26/11 163 lb 12.8 oz (74.299 kg)  06/11/11 152 lb 12.8 oz (69.31 kg)    Has been off methadone in last month. He states he is doing well off this. Went through withdrawal... Over this now. Not having many craving for drugs. Wants to stay away from daughter. Plan stay off the methadone.  Smoking every  1 pack in 2-5 days.   Review of Systems  Constitutional: Negative for fever and fatigue.  Respiratory: Negative for shortness of breath.   Cardiovascular: Negative for chest pain.  Gastrointestinal: Negative for abdominal pain.  Neurological:       Shaky weak feeling       Objective:   Physical Exam  Constitutional: Vital signs are normal. He appears cachectic.  HENT:  Head: Normocephalic.  Right Ear: Hearing normal.  Left Ear: Hearing normal.  Nose: Nose normal.  Mouth/Throat: Oropharynx is clear and moist and mucous membranes are normal.  Neck: Trachea normal. Carotid bruit is not present. No mass and no thyromegaly present.  Cardiovascular: Normal rate, regular rhythm and normal pulses.  Exam reveals no gallop, no distant heart sounds and no friction rub.   No murmur heard. No peripheral edema  Pulmonary/Chest: Effort normal and breath sounds normal. No respiratory distress.  Skin: Skin is warm,  dry and intact. No rash noted.  Psychiatric: He has a normal mood and affect. His speech is normal and behavior is normal. Thought content normal.          Assessment & Plan:

## 2013-05-04 ENCOUNTER — Other Ambulatory Visit: Payer: Self-pay | Admitting: *Deleted

## 2013-05-04 MED ORDER — ALPRAZOLAM 1 MG PO TABS: ORAL_TABLET | ORAL | Status: DC

## 2013-05-04 NOTE — Telephone Encounter (Signed)
rx faxed to pharmacy

## 2013-06-02 ENCOUNTER — Other Ambulatory Visit: Payer: Self-pay | Admitting: *Deleted

## 2013-06-02 MED ORDER — ALPRAZOLAM 1 MG PO TABS: ORAL_TABLET | ORAL | Status: DC

## 2013-06-02 NOTE — Telephone Encounter (Signed)
Patient advised rx called to pharmacy.  

## 2013-06-29 ENCOUNTER — Other Ambulatory Visit: Payer: Self-pay

## 2013-06-29 MED ORDER — ALPRAZOLAM 1 MG PO TABS: ORAL_TABLET | ORAL | Status: DC

## 2013-06-29 NOTE — Telephone Encounter (Signed)
rx called pharmacy  

## 2013-06-29 NOTE — Telephone Encounter (Signed)
Pt request refill alprazolam to [facility]; pt said took his last alprazolam today.Please advise. Pt request cb.

## 2013-07-13 ENCOUNTER — Telehealth: Payer: Self-pay | Admitting: *Deleted

## 2013-07-13 MED ORDER — AMOXICILLIN 500 MG PO CAPS: 1000.0000 mg | ORAL_CAPSULE | Freq: Two times a day (BID) | ORAL | Status: DC

## 2013-07-13 NOTE — Telephone Encounter (Signed)
Spoke with patient and advised results   

## 2013-07-13 NOTE — Telephone Encounter (Signed)
Asking for antibiotic for sore tooth, please advise

## 2013-07-13 NOTE — Telephone Encounter (Signed)
Will treat with amoxicillin x 10 days. Pt needs to set up appt with dentist for further eval and treat.

## 2013-07-27 ENCOUNTER — Other Ambulatory Visit: Payer: Self-pay

## 2013-07-27 MED ORDER — ALPRAZOLAM 1 MG PO TABS: ORAL_TABLET | ORAL | Status: DC

## 2013-07-27 NOTE — Telephone Encounter (Signed)
Patient's mother notified as instructed by telephone.Medication phoned to [facility] pharmacy spoke with [facility] as instructed.

## 2013-07-27 NOTE — Telephone Encounter (Signed)
Overdue for CPX.  Have pt make appt in next 2-3 months. Will refill  Until then.

## 2013-07-27 NOTE — Telephone Encounter (Signed)
Pt's mother, Jacki Cones request refill alprazolam to [facility].Please advise.

## 2013-08-23 ENCOUNTER — Other Ambulatory Visit: Payer: Self-pay

## 2013-08-23 NOTE — Telephone Encounter (Signed)
Jacki Cones asked me to ck status of alprazolam refill; advised still pending.

## 2013-08-23 NOTE — Telephone Encounter (Signed)
Lauries pt's mother request refill alprazolam to [facility].Please advise.

## 2013-08-24 MED ORDER — ALPRAZOLAM 1 MG PO TABS: ORAL_TABLET | ORAL | Status: DC

## 2013-08-24 NOTE — Telephone Encounter (Signed)
Called to [facility] 

## 2013-09-21 ENCOUNTER — Other Ambulatory Visit: Payer: Self-pay

## 2013-09-21 MED ORDER — ALPRAZOLAM 1 MG PO TABS: ORAL_TABLET | ORAL | Status: DC

## 2013-09-21 NOTE — Telephone Encounter (Signed)
Pt left v/m requesting refill alprazolam to [facility]. Pt request cb when refilled. Pt is out of med. Please advise.

## 2013-09-21 NOTE — Telephone Encounter (Signed)
Medication phoned to [facility] pharmacy as instructed. Pt notified med called in.

## 2013-10-19 ENCOUNTER — Other Ambulatory Visit: Payer: Self-pay

## 2013-10-19 NOTE — Telephone Encounter (Signed)
Pt left vm requesting refill alprazolam to [facility]. Please advise.

## 2013-10-20 ENCOUNTER — Encounter: Payer: Self-pay | Admitting: Radiology

## 2013-10-20 MED ORDER — ALPRAZOLAM 1 MG PO TABS: ORAL_TABLET | ORAL | Status: DC

## 2013-10-20 NOTE — Telephone Encounter (Signed)
I am going to defer to Dr. Ermalene Searing here and not refill.   This patient needs to come in and do his UDS, also, which does not appear to have been done yet.

## 2013-10-20 NOTE — Telephone Encounter (Signed)
Frederick Young notified Dr. Patsy Lager will write his prescription for his alprazolam but he will need to come to office and pick up prescription and see Marcelle Smiling to sign control substance contract and do a urine drug screen.

## 2013-10-20 NOTE — Telephone Encounter (Signed)
Last office visit 04/06/2013.  Ok to refill? 

## 2013-10-20 NOTE — Telephone Encounter (Signed)
Pt left v/m and request refill alprazolam today; pt is out of med. Pt request cb when refilled.

## 2013-11-15 ENCOUNTER — Encounter: Payer: Self-pay | Admitting: Family Medicine

## 2013-11-18 ENCOUNTER — Encounter: Payer: Self-pay | Admitting: Radiology

## 2013-11-19 ENCOUNTER — Ambulatory Visit: Payer: Self-pay | Admitting: Family Medicine

## 2013-11-19 ENCOUNTER — Telehealth: Payer: Self-pay | Admitting: Family Medicine

## 2013-11-19 NOTE — Telephone Encounter (Signed)
Pt came to appt today as we had asked for urine drug screen given he failed his last UDS her 1 month ago.  He had no money to be seen for appt.  I recommended since the main thing we needed was a urine drug screen, that we could have him do only this today and save the charge of an appointment. [facility] CMA discussed this with the patient. He refused urine drug screen. He demanded his alprazolam. He was again told that he needed to have clear UDS prior to any further refills of alprazolam.  He was upset. He was told that if he has symptoms of withdrawal he can go to the ER.  No medication refill was given or will be given until negative drug screen. Per Marcelline Mates the pt appeared high on a substance.  Given this patient's inability to follow the recommended plan, drug seeking behavoir, and breach of substance contract that he recently signed and was made well aware of in last month... He will be discharged from the clinic.

## 2013-11-22 ENCOUNTER — Encounter: Payer: Self-pay | Admitting: Family Medicine

## 2013-11-23 ENCOUNTER — Telehealth: Payer: Self-pay | Admitting: Family Medicine

## 2013-11-23 NOTE — Telephone Encounter (Addendum)
Patient dismissed from [facility] by Kerby Nora MD , effective November 22, 2013. Dismissal letter sent out by certified / registered mail. [facility]  Certified dismissal letter returned as undeliverable, unclaimed, return to sender after three attempts by [employer]. Letter placed in another envelope and resent as 1st class mail which does not require a signature. 12/22/2013 DAJ

## 2014-05-31 ENCOUNTER — Emergency Department (HOSPITAL_COMMUNITY)
Admission: EM | Admit: 2014-05-31 | Discharge: 2014-06-01 | Disposition: A | Discharge status: Other Acute Inpatient Hospital | Payer: Self-pay | Attending: Emergency Medicine | Admitting: Emergency Medicine

## 2014-05-31 ENCOUNTER — Encounter (HOSPITAL_COMMUNITY): Payer: Self-pay | Admitting: Emergency Medicine

## 2014-05-31 DIAGNOSIS — F17200 Nicotine dependence, unspecified, uncomplicated: Secondary | ICD-10-CM | POA: Insufficient documentation

## 2014-05-31 DIAGNOSIS — F1510 Other stimulant abuse, uncomplicated: Secondary | ICD-10-CM | POA: Insufficient documentation

## 2014-05-31 DIAGNOSIS — F1210 Cannabis abuse, uncomplicated: Secondary | ICD-10-CM | POA: Insufficient documentation

## 2014-05-31 DIAGNOSIS — F411 Generalized anxiety disorder: Secondary | ICD-10-CM | POA: Insufficient documentation

## 2014-05-31 DIAGNOSIS — F1310 Sedative, hypnotic or anxiolytic abuse, uncomplicated: Secondary | ICD-10-CM | POA: Insufficient documentation

## 2014-05-31 DIAGNOSIS — R002 Palpitations: Secondary | ICD-10-CM | POA: Insufficient documentation

## 2014-05-31 DIAGNOSIS — Z79899 Other long term (current) drug therapy: Secondary | ICD-10-CM | POA: Insufficient documentation

## 2014-05-31 DIAGNOSIS — R197 Diarrhea, unspecified: Secondary | ICD-10-CM | POA: Insufficient documentation

## 2014-05-31 DIAGNOSIS — R110 Nausea: Secondary | ICD-10-CM | POA: Insufficient documentation

## 2014-05-31 DIAGNOSIS — F1910 Other psychoactive substance abuse, uncomplicated: Secondary | ICD-10-CM

## 2014-05-31 DIAGNOSIS — F1110 Opioid abuse, uncomplicated: Secondary | ICD-10-CM | POA: Insufficient documentation

## 2014-05-31 LAB — RAPID URINE DRUG SCREEN, HOSP PERFORMED
Amphetamines: POSITIVE — AB
Barbiturates: NOT DETECTED
Benzodiazepines: POSITIVE — AB
Cocaine: NOT DETECTED
Opiates: POSITIVE — AB
Tetrahydrocannabinol: POSITIVE — AB

## 2014-05-31 LAB — CBC WITH DIFFERENTIAL/PLATELET
Basophils Absolute: 0 10*3/uL (ref 0.0–0.1)
Basophils Relative: 0 % (ref 0–1)
Eosinophils Absolute: 0 10*3/uL (ref 0.0–0.7)
Eosinophils Relative: 0 % (ref 0–5)
HCT: 48.2 % (ref 39.0–52.0)
HEMOGLOBIN: 16.7 g/dL (ref 13.0–17.0)
LYMPHS ABS: 1.8 10*3/uL (ref 0.7–4.0)
Lymphocytes Relative: 14 % (ref 12–46)
MCH: 31.9 pg (ref 26.0–34.0)
MCHC: 34.6 g/dL (ref 30.0–36.0)
MCV: 92 fL (ref 78.0–100.0)
MONOS PCT: 13 % — AB (ref 3–12)
Monocytes Absolute: 1.6 10*3/uL — ABNORMAL HIGH (ref 0.1–1.0)
NEUTROS PCT: 73 % (ref 43–77)
Neutro Abs: 9.4 10*3/uL — ABNORMAL HIGH (ref 1.7–7.7)
Platelets: 296 10*3/uL (ref 150–400)
RBC: 5.24 MIL/uL (ref 4.22–5.81)
RDW: 12.4 % (ref 11.5–15.5)
WBC: 12.8 10*3/uL — ABNORMAL HIGH (ref 4.0–10.5)

## 2014-05-31 LAB — ACETAMINOPHEN LEVEL: Acetaminophen (Tylenol), Serum: <15 ug/mL (ref 10–30)

## 2014-05-31 LAB — COMPREHENSIVE METABOLIC PANEL
ALK PHOS: 73 U/L (ref 39–117)
ALT: 13 U/L (ref 0–53)
AST: 18 U/L (ref 0–37)
Albumin: 3.9 g/dL (ref 3.5–5.2)
BUN: 5 mg/dL — ABNORMAL LOW (ref 6–23)
CHLORIDE: 104 meq/L (ref 96–112)
CO2: 25 meq/L (ref 19–32)
Calcium: 9.8 mg/dL (ref 8.4–10.5)
Creatinine, Ser: 0.9 mg/dL (ref 0.50–1.35)
GLUCOSE: 102 mg/dL — AB (ref 70–99)
POTASSIUM: 3.5 meq/L — AB (ref 3.7–5.3)
Sodium: 142 mEq/L (ref 137–147)
Total Bilirubin: 0.2 mg/dL — ABNORMAL LOW (ref 0.3–1.2)
Total Protein: 7.4 g/dL (ref 6.0–8.3)

## 2014-05-31 LAB — ETHANOL

## 2014-05-31 LAB — SALICYLATE LEVEL: Salicylate Lvl: <2 mg/dL — ABNORMAL LOW (ref 2.8–20.0)

## 2014-05-31 MED ORDER — IBUPROFEN 200 MG PO TABS: 600.0000 mg | ORAL_TABLET | Freq: Three times a day (TID) | ORAL | Status: DC | PRN

## 2014-05-31 MED ORDER — ACETAMINOPHEN 325 MG PO TABS: 650.0000 mg | ORAL_TABLET | ORAL | Status: DC | PRN

## 2014-05-31 MED ORDER — LORAZEPAM 1 MG PO TABS: 0.0000 mg | ORAL_TABLET | Freq: Four times a day (QID) | ORAL | Status: DC

## 2014-05-31 MED ORDER — NICOTINE 21 MG/24HR TD PT24
21.0000 mg | MEDICATED_PATCH | Freq: Every day | TRANSDERMAL | Status: DC
  Administered 2014-05-31: 21 mg via TRANSDERMAL
  Filled 2014-05-31: qty 1

## 2014-05-31 MED ORDER — ALUM & MAG HYDROXIDE-SIMETH 200-200-20 MG/5ML PO SUSP: 30.0000 mL | ORAL | Status: DC | PRN

## 2014-05-31 MED ORDER — CLONAZEPAM 1 MG PO TABS
2.0000 mg | ORAL_TABLET | Freq: Two times a day (BID) | ORAL | Status: DC
  Administered 2014-05-31: 2 mg via ORAL
  Filled 2014-05-31: qty 2

## 2014-05-31 MED ORDER — ONDANSETRON HCL 4 MG PO TABS: 4.0000 mg | ORAL_TABLET | Freq: Three times a day (TID) | ORAL | Status: DC | PRN

## 2014-05-31 MED ORDER — NICOTINE 21 MG/24HR TD PT24: 21.0000 mg | MEDICATED_PATCH | Freq: Every day | TRANSDERMAL | Status: DC

## 2014-05-31 MED ORDER — ONDANSETRON 4 MG PO TBDP
4.0000 mg | ORAL_TABLET | Freq: Once | ORAL | Status: AC
  Administered 2014-05-31: 4 mg via ORAL
  Filled 2014-05-31: qty 1

## 2014-05-31 MED ORDER — LORAZEPAM 1 MG PO TABS: 1.0000 mg | ORAL_TABLET | Freq: Three times a day (TID) | ORAL | Status: DC | PRN

## 2014-05-31 MED ORDER — ZOLPIDEM TARTRATE 5 MG PO TABS: 5.0000 mg | ORAL_TABLET | Freq: Every evening | ORAL | Status: DC | PRN

## 2014-05-31 MED ORDER — VITAMIN B-1 100 MG PO TABS
100.0000 mg | ORAL_TABLET | Freq: Every day | ORAL | Status: DC
  Administered 2014-05-31: 100 mg via ORAL
  Filled 2014-05-31: qty 1

## 2014-05-31 MED ORDER — LORAZEPAM 1 MG PO TABS: 0.0000 mg | ORAL_TABLET | Freq: Two times a day (BID) | ORAL | Status: DC

## 2014-05-31 MED ORDER — THIAMINE HCL 100 MG/ML IJ SOLN: 100.0000 mg | Freq: Every day | INTRAMUSCULAR | Status: DC

## 2014-05-31 NOTE — ED Notes (Signed)
[facility]untary [facility] by Clinical research associatewriter.

## 2014-05-31 NOTE — ED Provider Notes (Signed)
CSN: [id number]     Arrival date & time 05/31/14  1726 History   First MD Initiated Contact with Patient 05/31/14 1839     Chief Complaint  Patient presents with  . Addiction Problem     (Consider location/radiation/quality/duration/timing/severity/associated sxs/prior Treatment) The history is provided by the patient and medical records.   This is a 33 y.o. M with PMH significant for polysubstance abuse, anxiety, hypercholesterolemia, presenting to the ED requesting detox. Patient states he's been using heroin on off for the past 10 years, last use was yesterday. States he attempted to detox once before at a methadone clinic, however he relapsed due to life stressors. Patient states he's been off methadone for approximately one month. Patient also states he needs help with alcohol abuse.  Patient states he is a daily drinker, approximately 3-4 pints daily of malt liquor, last drink was earlier today.  Pt denies any other illicit drug use.  Pt states he has been somewhat nauseated with watery, non-bloody diarrhea earlier today.  Denies any abdominal pain or vomiting.  No tremors or seizure activity. Pt is a daily cigarette smoker.  Denies SI/HI/AVH.  VS stable on arrival.  Past Medical History  Diagnosis Date  . Anxiety   . Substance abuse   . Heart palpitations   . Hypercholesteremia    History reviewed. No pertinent past surgical history. Family History  Problem Relation Age of Onset  . Anxiety disorder Mother   . Stroke Paternal Grandmother   . Cancer Paternal Grandmother    History  Substance Use Topics  . Smoking status: Current Every Day Smoker -- 1.00 packs/day for 16 years  . Smokeless tobacco: Not on file  . Alcohol Use: 0.0 oz/week     Comment: 2-3 pints daily    Review of Systems  Gastrointestinal: Positive for nausea and diarrhea.  Psychiatric/Behavioral:       Detox  All other systems reviewed and are negative.     Allergies  Review of patient's allergies  indicates no known allergies.  Home Medications   Prior to Admission medications   Medication Sig Start Date End Date Taking? Authorizing Provider  ALPRAZolam Prudy Feeler(XANAX) 1 MG tablet 1 tablet 3 times daily, make take one additional tablet at night as needed for anxiety and sleep 10/19/13  Yes Hannah BeatSpencer Copland, MD  clonazePAM (KLONOPIN) 2 MG tablet Take 2 mg by mouth 2 (two) times daily.   Yes Historical Provider, MD  EPINEPHrine (EPI-PEN) 0.3 mg/0.3 mL DEVI Inject 0.3 mg into the muscle once as needed. If stung by bee    Historical Provider, MD   BP 125/78  Pulse 90  Temp(Src) 98.2 F (36.8 C) (Oral)  Resp 20  SpO2 100%  Physical Exam  Nursing note and vitals reviewed. Constitutional: He is oriented to person, place, and time. He appears well-developed and well-nourished. No distress.  HENT:  Head: Normocephalic and atraumatic.  Mouth/Throat: Oropharynx is clear and moist.  Eyes: Conjunctivae and EOM are normal. Pupils are equal, round, and reactive to light.  Neck: Normal range of motion. Neck supple.  Cardiovascular: Normal rate, regular rhythm and normal heart sounds.   Pulmonary/Chest: Effort normal and breath sounds normal. No respiratory distress. He has no wheezes.  Abdominal: Soft. Bowel sounds are normal. There is no tenderness. There is no guarding.  Musculoskeletal: Normal range of motion.  Neurological: He is alert and oriented to person, place, and time.  Skin: Skin is warm and dry. He is not diaphoretic.  Psychiatric: He  has a normal mood and affect. His behavior is normal. He is not actively hallucinating. He expresses no homicidal and no suicidal ideation. He expresses no suicidal plans and no homicidal plans.  Denies SI/HI/AVH    ED Course  Procedures (including critical care time) Labs Review Labs Reviewed  CBC WITH DIFFERENTIAL - Abnormal; Notable for the following:    WBC 12.8 (*)    Neutro Abs 9.4 (*)    Monocytes Relative 13 (*)    Monocytes Absolute 1.6  (*)    All other components within normal limits  COMPREHENSIVE METABOLIC PANEL - Abnormal; Notable for the following:    Potassium 3.5 (*)    Glucose, Bld 102 (*)    BUN 5 (*)    Total Bilirubin 0.2 (*)    All other components within normal limits  URINE RAPID DRUG SCREEN (HOSP PERFORMED) - Abnormal; Notable for the following:    Opiates POSITIVE (*)    Benzodiazepines POSITIVE (*)    Amphetamines POSITIVE (*)    Tetrahydrocannabinol POSITIVE (*)    All other components within normal limits  SALICYLATE LEVEL - Abnormal; Notable for the following:    Salicylate Lvl <2.0 (*)    All other components within normal limits  ETHANOL  ACETAMINOPHEN LEVEL    Imaging Review No results found.   EKG Interpretation None      MDM   Final diagnoses:  Polysubstance abuse  NICOTINE ADDICTION   33 year old male requesting detox from heroin and alcohol. Last use of heroin was last night, last drink earlier today. On exam patient is afebrile and overall nontoxic appearing. He admits to some nausea and diarrhea earlier today, but does not appear to be in acute withdrawal.  His abdominal exam is benign then he denies any current abdominal pain.  Labs as above.  Pt medically cleared and awaiting TTS evaluation.  TTS has evaluated pt and [facility] care of Dr. Dub MikesLugo.  EMTALA completed, pt will be transferred.  Garlon HatchetLisa M Sanders, PA-C 05/31/14 2357

## 2014-05-31 NOTE — BH Assessment (Signed)
Tele Assessment Note   Frederick NeighboursChristopher J Young is an 33 y.o. male who [facility] due to alcohol and heroin. Pt also endorsing some withdrawal symptoms. Pt denies current SI, HI and AVH. Pt Ox4. Pt reported depressed and anxious mood with congruent affect.   Pt reported outpatient treatment hx for substance use but denies any inpatient. Pt reported he stated at a methadone clinic about 6 months ago and was kicked out 2 months ago after failing a drug test. Pt reported he has lost a lot of people in his life and he has been stressed. Pt also reported that he has 2 kids ages 303 and 119 y/o and he wants to be sober for them. Pt reported he currently lives with his brother. Pt reported he currently has not car and due to no dependable transportation, he is unemployed. Pt identified parents as natural supports.    Axis I: Alcohol Use Disorder, severe and Opioid Use Disorder, severe Axis II: Deferred Axis III:  Past Medical History  Diagnosis Date  . Anxiety   . Substance abuse   . Heart palpitations   . Hypercholesteremia    Axis IV: economic problems, housing problems, other psychosocial or environmental problems and problems related to legal system/crime  Past Medical History:  Past Medical History  Diagnosis Date  . Anxiety   . Substance abuse   . Heart palpitations   . Hypercholesteremia     History reviewed. No pertinent past surgical history.  Family History:  Family History  Problem Relation Age of Onset  . Anxiety disorder Mother   . Stroke Paternal Grandmother   . Cancer Paternal Grandmother     Social History:  reports that he has been smoking.  He does not have any smokeless tobacco history on file. He reports that he drinks alcohol. He reports that he uses illicit drugs.  Additional Social History:  Alcohol / Drug Use Pain Medications: none reported Prescriptions: Xanax, Klonopin Over the Counter: none reported History of alcohol / drug  use?: Yes Longest period of sobriety (when/how long): 5 years from 2007-2012 Withdrawal Symptoms: Diarrhea;Nausea / Vomiting;Sweats;Cramps;Fever / Chills Substance #1 Name of Substance 1: etoh 1 - Age of First Use: unk 1 - Amount (size/oz): 1 pint 1 - Frequency:  daily 1 - Duration: 6 months 1 - Last Use / Amount: 05/31/14 Substance #2 Name of Substance 2: heroin 2 - Age of First Use: unk 2 - Amount (size/oz): 2 grams` 2 - Frequency: daily 2 - Duration: ongoing for 10 years 2 - Last Use / Amount: 05/30/14  CIWA: CIWA-Ar BP: 113/75 mmHg Pulse Rate: 68 COWS:    Allergies: No Known Allergies  Home Medications:  (Not in a hospital admission)  OB/GYN Status:  No LMP for male patient.  General Assessment Data Location of Assessment: [facility] Is this a Tele or Face-to-Face Assessment?: Tele Assessment Is this an Initial Assessment or a Re-assessment for this encounter?: Initial Assessment Living Arrangements: Other relatives (Pt living with brother.) Can pt return to current living arrangement?: Yes Admission Status: Voluntary Is patient capable of signing voluntary admission?: Yes Transfer from: Acute Hospital Referral Source: Self/Family/Friend  Medical Screening Exam [facility] Walk-in ONLY) Medical Exam completed:  (NA)  [facility] Living Arrangements: Other relatives (Pt living with brother.) Name of Psychiatrist:  (unk) Name of Therapist:  (none)     Risk to self Suicidal Ideation: No Suicidal Intent: No Is patient at risk for suicide?: No Suicidal  Plan?: No Access to Means: No What has been your use of drugs/alcohol within the last 12 months?:  (etoh and heroin) Previous Attempts/Gestures: No How many times?: 0 Other Self Harm Risks:  (none reported) Triggers for Past Attempts: None known Intentional Self Injurious Behavior: None Family Suicide History: Unknown Recent stressful life event(s): Financial Problems;Job Loss Persecutory voices/beliefs?:  No Depression: Yes Depression Symptoms: Despondent;Insomnia;Guilt;Fatigue Substance abuse history and/or treatment for substance abuse?: Yes Suicide prevention information given to non-admitted patients: Not applicable  Risk to Others Homicidal Ideation: No Thoughts of Harm to Others: No Current Homicidal Intent: No Current Homicidal Plan: No Access to Homicidal Means: No Identified Victim:  (NA) History of harm to others?: No Assessment of Violence: None Noted Violent Behavior Description:  (Pt calm and cooperative at assessment. ) Does patient have access to weapons?: No Criminal Charges Pending?: Yes Describe Pending Criminal Charges:  (Possession, open container) Does patient have a court date: Yes Court Date:  (07/07/14)  Psychosis Hallucinations: None noted Delusions: None noted  Mental Status Report Appear/Hygiene: In scrubs Eye Contact: Fair Motor Activity: Unremarkable Speech: Logical/coherent Level of Consciousness: Alert Mood: Depressed;Anxious Affect: Anxious;Depressed Anxiety Level: Moderate Thought Processes: Coherent;Relevant Judgement: Impaired Orientation: Person;Place;Time;Situation Obsessive Compulsive Thoughts/Behaviors: None  Cognitive Functioning Concentration: Normal Memory: Recent Intact;Remote Intact IQ: Average Insight: Fair Impulse Control: Poor Appetite: Poor (Pt reported he hasn't eaten in 2 days.) Weight Loss:  (unk) Weight Gain:  (none) Sleep: Decreased Total Hours of Sleep: 3 Vegetative Symptoms: None  ADLScreening [facility] Assessment Services) Patient's cognitive ability adequate to safely complete daily activities?: Yes Patient able to express need for assistance with ADLs?: Yes Independently performs ADLs?: Yes (appropriate for developmental age)  Prior Inpatient Therapy Prior Inpatient Therapy: No Prior Therapy Dates:  (NA) Prior Therapy Facilty/Provider(s):  (NA) Reason for Treatment:  (NA)  Prior Outpatient  Therapy Prior Outpatient Therapy: Yes Prior Therapy Dates:  (12/2013, 2007, others) Prior Therapy Facilty/Provider(s):  ([facility] Reason for Treatment:  (SA)  ADL Screening (condition at time of admission) Patient's cognitive ability adequate to safely complete daily activities?: Yes Is the patient deaf or have difficulty hearing?: No Does the patient have difficulty seeing, even when wearing glasses/contacts?: No Does the patient have difficulty concentrating, remembering, or making decisions?: No Patient able to express need for assistance with ADLs?: Yes Does the patient have difficulty dressing or bathing?: No Independently performs ADLs?: Yes (appropriate for developmental age)       Abuse/Neglect Assessment (Assessment to be complete while patient is alone) Physical Abuse: Denies Verbal Abuse: Denies Sexual Abuse: Denies Exploitation of patient/patient's resources: Denies Self-Neglect: Denies Values / Beliefs Cultural Requests During Hospitalization: None Spiritual Requests During Hospitalization: None   Advance Directives (For Healthcare) Advance Directive: Patient does not have advance directive    Additional Information 1:1 In Past 12 Months?: No CIRT Risk: No Elopement Risk: No Does patient have medical clearance?: Yes     Disposition:  Clinician consulted with Donell SievertSpencer Simon, PA who recommends pt for inpatient detox. Tammi SouKennisha, [facility] reported pt accepted to bed 302-2 to Dr. Dub MikesLugo.     Disposition Initial Assessment Completed for this Encounter: Yes Disposition of Patient: Inpatient treatment program Type of inpatient treatment program: Adult  Stewart,Delilah R 05/31/2014 11:05 PM

## 2014-05-31 NOTE — BH Assessment (Signed)
Clinician gathered report prior to assessing pt from Lisa,PA. PA reported pt requesting detox. Pt has hx of using several substances. Pt reporting he needs help with alcohol abuse. Pt denying SI, HI and AVH. Assessment to be initiated.   Frederick Young, MSW, LCSW Triage Specialist [phone]

## 2014-05-31 NOTE — ED Notes (Signed)
Per pt, has been using heroine off /on for 10 years and wants rehab.  Pt has been on methadone before.  Off for last month.  Also wants help with alcohol

## 2014-05-31 NOTE — ED Notes (Signed)
Pt requesting diarrhea medication. Misty StanleyLisa, GeorgiaPA made aware.

## 2014-05-31 NOTE — BH Assessment (Signed)
Clinician consulted with Donell SievertSpencer Simon, PA who recommends pt for inpatient detox. Tammi SouKennisha, [facility] reported pt accepted to bed 302[facility]2 to Dr. Dub MikesLugo.  Clinician provided updates to [facility] and Charity fundraiserN.  Yaakov Guthrieelilah Stewart, MSW, LCSW Triage Specialist [phone]

## 2014-05-31 NOTE — ED Notes (Addendum)
Family took pt;s cell phone. Pt has plaid shorts one pair on socks, tenne shoes, drivers license, prepaid card, and T shirt. Pt has been wand by security. Pt's belongings were placed in locker num 35 in TCU, mother took pt's lip rings

## 2014-06-01 ENCOUNTER — Encounter (HOSPITAL_COMMUNITY): Payer: Self-pay

## 2014-06-01 ENCOUNTER — Inpatient Hospital Stay (HOSPITAL_COMMUNITY)
Admission: EM | Admit: 2014-06-01 | Discharge: 2014-06-08 | DRG: 897 | Disposition: A | Discharge status: Rehabilitation Facility | Payer: Federal, State, Local not specified - Other | Source: Intra-hospital | Attending: Psychiatry | Admitting: Psychiatry

## 2014-06-01 DIAGNOSIS — F1020 Alcohol dependence, uncomplicated: Principal | ICD-10-CM | POA: Diagnosis present

## 2014-06-01 DIAGNOSIS — F1120 Opioid dependence, uncomplicated: Secondary | ICD-10-CM | POA: Diagnosis present

## 2014-06-01 DIAGNOSIS — F1220 Cannabis dependence, uncomplicated: Secondary | ICD-10-CM | POA: Diagnosis present

## 2014-06-01 DIAGNOSIS — F1320 Sedative, hypnotic or anxiolytic dependence, uncomplicated: Secondary | ICD-10-CM | POA: Diagnosis present

## 2014-06-01 DIAGNOSIS — Z818 Family history of other mental and behavioral disorders: Secondary | ICD-10-CM

## 2014-06-01 DIAGNOSIS — F1123 Opioid dependence with withdrawal: Secondary | ICD-10-CM

## 2014-06-01 DIAGNOSIS — F17200 Nicotine dependence, unspecified, uncomplicated: Secondary | ICD-10-CM | POA: Diagnosis present

## 2014-06-01 DIAGNOSIS — F10230 Alcohol dependence with withdrawal, uncomplicated: Secondary | ICD-10-CM

## 2014-06-01 DIAGNOSIS — E7800 Pure hypercholesterolemia, unspecified: Secondary | ICD-10-CM | POA: Diagnosis present

## 2014-06-01 DIAGNOSIS — F411 Generalized anxiety disorder: Secondary | ICD-10-CM | POA: Diagnosis present

## 2014-06-01 DIAGNOSIS — F1994 Other psychoactive substance use, unspecified with psychoactive substance-induced mood disorder: Secondary | ICD-10-CM | POA: Diagnosis present

## 2014-06-01 DIAGNOSIS — Z823 Family history of stroke: Secondary | ICD-10-CM

## 2014-06-01 DIAGNOSIS — F410 Panic disorder [episodic paroxysmal anxiety] without agoraphobia: Secondary | ICD-10-CM | POA: Diagnosis present

## 2014-06-01 MED ORDER — ONDANSETRON 4 MG PO TBDP: 4.0000 mg | ORAL_TABLET | Freq: Four times a day (QID) | ORAL | Status: DC | PRN

## 2014-06-01 MED ORDER — NAPROXEN 500 MG PO TABS
500.0000 mg | ORAL_TABLET | Freq: Two times a day (BID) | ORAL | Status: AC | PRN
  Administered 2014-06-01 – 2014-06-03 (×4): 500 mg via ORAL
  Filled 2014-06-01 (×4): qty 1

## 2014-06-01 MED ORDER — VITAMIN B-1 100 MG PO TABS
100.0000 mg | ORAL_TABLET | Freq: Every day | ORAL | Status: DC
  Administered 2014-06-02 – 2014-06-07 (×6): 100 mg via ORAL
  Filled 2014-06-01 (×8): qty 1

## 2014-06-01 MED ORDER — CLONIDINE HCL 0.1 MG PO TABS
0.1000 mg | ORAL_TABLET | ORAL | Status: DC
  Filled 2014-06-01 (×2): qty 1

## 2014-06-01 MED ORDER — CHLORDIAZEPOXIDE HCL 25 MG PO CAPS
25.0000 mg | ORAL_CAPSULE | Freq: Four times a day (QID) | ORAL | Status: AC
  Administered 2014-06-01 – 2014-06-02 (×6): 25 mg via ORAL
  Filled 2014-06-01 (×6): qty 1

## 2014-06-01 MED ORDER — CLONIDINE HCL 0.1 MG PO TABS: 0.1000 mg | ORAL_TABLET | Freq: Every day | ORAL | Status: DC

## 2014-06-01 MED ORDER — NICOTINE 21 MG/24HR TD PT24
21.0000 mg | MEDICATED_PATCH | Freq: Every day | TRANSDERMAL | Status: DC
  Administered 2014-06-01 – 2014-06-07 (×7): 21 mg via TRANSDERMAL
  Filled 2014-06-01 (×10): qty 1

## 2014-06-01 MED ORDER — CHLORDIAZEPOXIDE HCL 25 MG PO CAPS
25.0000 mg | ORAL_CAPSULE | Freq: Four times a day (QID) | ORAL | Status: AC | PRN
  Administered 2014-06-01 (×2): 25 mg via ORAL
  Filled 2014-06-01 (×3): qty 1

## 2014-06-01 MED ORDER — CHLORDIAZEPOXIDE HCL 25 MG PO CAPS
25.0000 mg | ORAL_CAPSULE | Freq: Three times a day (TID) | ORAL | Status: AC
  Administered 2014-06-02 – 2014-06-03 (×3): 25 mg via ORAL
  Filled 2014-06-01 (×3): qty 1

## 2014-06-01 MED ORDER — HYDROXYZINE HCL 25 MG PO TABS: 25.0000 mg | ORAL_TABLET | Freq: Four times a day (QID) | ORAL | Status: DC | PRN

## 2014-06-01 MED ORDER — MAGNESIUM HYDROXIDE 400 MG/5ML PO SUSP: 30.0000 mL | Freq: Every day | ORAL | Status: DC | PRN

## 2014-06-01 MED ORDER — ACETAMINOPHEN 325 MG PO TABS
650.0000 mg | ORAL_TABLET | Freq: Four times a day (QID) | ORAL | Status: DC | PRN
  Administered 2014-06-01 – 2014-06-06 (×6): 650 mg via ORAL
  Filled 2014-06-01 (×6): qty 2

## 2014-06-01 MED ORDER — METHOCARBAMOL 500 MG PO TABS
500.0000 mg | ORAL_TABLET | Freq: Three times a day (TID) | ORAL | Status: AC | PRN
  Administered 2014-06-01 – 2014-06-04 (×5): 500 mg via ORAL
  Filled 2014-06-01 (×5): qty 1

## 2014-06-01 MED ORDER — LISINOPRIL 20 MG PO TABS
20.0000 mg | ORAL_TABLET | Freq: Once | ORAL | Status: AC
  Administered 2014-06-01: 20 mg via ORAL
  Filled 2014-06-01: qty 1

## 2014-06-01 MED ORDER — ENSURE COMPLETE PO LIQD
237.0000 mL | Freq: Two times a day (BID) | ORAL | Status: DC
  Administered 2014-06-01 – 2014-06-02 (×2): 237 mL via ORAL

## 2014-06-01 MED ORDER — ONDANSETRON 4 MG PO TBDP
4.0000 mg | ORAL_TABLET | Freq: Four times a day (QID) | ORAL | Status: AC | PRN
  Administered 2014-06-03: 4 mg via ORAL
  Filled 2014-06-01: qty 1

## 2014-06-01 MED ORDER — CHLORDIAZEPOXIDE HCL 25 MG PO CAPS
25.0000 mg | ORAL_CAPSULE | ORAL | Status: AC
  Administered 2014-06-03 – 2014-06-04 (×2): 25 mg via ORAL
  Filled 2014-06-01 (×2): qty 1

## 2014-06-01 MED ORDER — ALUM & MAG HYDROXIDE-SIMETH 200-200-20 MG/5ML PO SUSP: 30.0000 mL | ORAL | Status: DC | PRN

## 2014-06-01 MED ORDER — LISINOPRIL 10 MG PO TABS
10.0000 mg | ORAL_TABLET | Freq: Every day | ORAL | Status: DC
  Administered 2014-06-02 – 2014-06-07 (×6): 10 mg via ORAL
  Filled 2014-06-01 (×7): qty 1
  Filled 2014-06-01: qty 14

## 2014-06-01 MED ORDER — ADULT MULTIVITAMIN W/MINERALS CH
1.0000 | ORAL_TABLET | Freq: Every day | ORAL | Status: DC
  Administered 2014-06-01 – 2014-06-07 (×7): 1 via ORAL
  Filled 2014-06-01 (×9): qty 1

## 2014-06-01 MED ORDER — DICYCLOMINE HCL 20 MG PO TABS
20.0000 mg | ORAL_TABLET | Freq: Four times a day (QID) | ORAL | Status: AC | PRN
  Administered 2014-06-02 – 2014-06-04 (×2): 20 mg via ORAL
  Filled 2014-06-01 (×2): qty 1

## 2014-06-01 MED ORDER — CHLORDIAZEPOXIDE HCL 25 MG PO CAPS
25.0000 mg | ORAL_CAPSULE | Freq: Every day | ORAL | Status: AC
  Administered 2014-06-03 – 2014-06-05 (×2): 25 mg via ORAL

## 2014-06-01 MED ORDER — THIAMINE HCL 100 MG/ML IJ SOLN: 100.0000 mg | Freq: Once | INTRAMUSCULAR | Status: DC

## 2014-06-01 MED ORDER — CLONIDINE HCL 0.1 MG PO TABS
0.1000 mg | ORAL_TABLET | Freq: Four times a day (QID) | ORAL | Status: AC
  Administered 2014-06-01 – 2014-06-03 (×6): 0.1 mg via ORAL
  Filled 2014-06-01 (×11): qty 1

## 2014-06-01 MED ORDER — HYDROXYZINE HCL 25 MG PO TABS
25.0000 mg | ORAL_TABLET | Freq: Four times a day (QID) | ORAL | Status: AC | PRN
  Administered 2014-06-01 – 2014-06-03 (×3): 25 mg via ORAL
  Filled 2014-06-01 (×3): qty 1

## 2014-06-01 MED ORDER — LOPERAMIDE HCL 2 MG PO CAPS
2.0000 mg | ORAL_CAPSULE | ORAL | Status: AC | PRN
  Administered 2014-06-01: 2 mg via ORAL
  Administered 2014-06-01 (×2): 4 mg via ORAL
  Filled 2014-06-01 (×2): qty 1
  Filled 2014-06-01: qty 2

## 2014-06-01 MED ORDER — LOPERAMIDE HCL 2 MG PO CAPS: 2.0000 mg | ORAL_CAPSULE | ORAL | Status: DC | PRN

## 2014-06-01 MED ORDER — TRAZODONE HCL 50 MG PO TABS
50.0000 mg | ORAL_TABLET | Freq: Every evening | ORAL | Status: DC | PRN
  Administered 2014-06-01 – 2014-06-07 (×13): 50 mg via ORAL
  Filled 2014-06-01 (×3): qty 1
  Filled 2014-06-01: qty 28
  Filled 2014-06-01 (×12): qty 1
  Filled 2014-06-01: qty 28
  Filled 2014-06-01: qty 1

## 2014-06-01 NOTE — [facility] Group Notes (Signed)
[facility]   06/01/2014 9:42 AM  Participation Quality:  DID NOT ATTEND-pt in room resting.   Smart, American FinancialHeather LCSWA

## 2014-06-01 NOTE — ED Notes (Signed)
Denied SI/HI/AVH. Denied pain. Hhe offered no questions or concerns. Escorted off the unit, belongings giving to [employer] driver.

## 2014-06-01 NOTE — [facility] Group Notes (Signed)
[facility] LCSW Group Therapy  06/01/2014 3:43 PM  Type of Therapy:  Group Therapy  Participation Level:  Did Not Attend-pt in room resting.   Smart, Heather LCSWA  06/01/2014, 3:43 PM

## 2014-06-01 NOTE — Progress Notes (Signed)
Admission note: [facility] from [facility]H and heroin. Patient has been drinking two pints of liquor daily and abusing heroin on a daily basis. Patient is prescribed Xanax and Klonopin for panic attacks but denies abusing it. He denies SI and HI. Denies AV hallucinations. Current CIWA is an 8. Current COWS is a 5. Has a history of being sober from 2007-2012. Was going to a methadone clinic six months ago but was kicked out for failing a drug test. Currently lives with his brother. Says his family is supportive but most of his friends have passed away. Calm and cooperative. Affect is flat. Mood is sad. Voices no complaints. Billy CoastGoodman, Barbara K, RN

## 2014-06-01 NOTE — [facility] Suicide Risk Assessment (Signed)
[facility] INPATIENT: Family/Significant Other Suicide Prevention Education  Suicide Prevention Education:  Education Completed; No one has been identified by the patient as the family member/significant other with whom the patient will be residing, and identified as the person(s) who will aid the patient in the event of a mental health crisis (suicidal ideations/suicide attempt).   Pt did not c/o SI at admission, nor have they endorsed SI during their stay here. SPE not required. SPI pamphlet provided to pt and he was encouraged to share information with support network, ask questions, and talk about any concerns relating to SPE.  The Sherwin-WilliamsHeather Smart, LCSWA 06/01/2014 11:42 AM

## 2014-06-01 NOTE — H&P (Signed)
Psychiatric Admission Assessment Adult  Patient Identification:  Frederick Young  Date of Evaluation:  06/01/2014  Chief Complaint:  Alcohol Dependence  History of Present Illness: Frederick Young is 33 years old, Caucasian male. Admitted from the [facility]. He reports, "My mother took me to the hospital yesterday. I needed detox treatment for heroin and alcohol. I have been using off & on x 10 years. I was on methadone treatment about 5 years ago for opioid addiction. It did help, but I still drank alcohol. I have not been sober from drugs and or alcohol. I use drugs & drink alcohol because they keep me going, other times, I use to cover my pain. I have not had any drug/alcohol treatment. I don't plan on going to any substance abuse treatment centers. I just want the drugs off of my systems, I know I will do better from there on. I would like to remain on Xanax because it was prescribed for me for heart palpitations. I'm not depressed",  Elements:  Location:  Opioid, benzodiazepine, Cannabis dependence. Quality:  Cravings, muscle cramps, headaches, facial flushing, nausea, high anxiety. Severity:  Severe, been using off & on x 10 years. Timing:  Started using drugs & alcohol at age 14. Duration:  Chronic, but continuous. Context:  "I use drugs & alcohol because they keep me going & covers my pain.  Associated Signs/Synptoms:  Depression Symptoms:  anxiety, panic attacks, loss of energy/fatigue,  (Hypo) Manic Symptoms:  Denies  Anxiety Symptoms:  Excessive Worry, Panic Symptoms,  Psychotic Symptoms:  Denies  PTSD Symptoms: NA  Psychiatric Specialty Exam: Physical Exam  Constitutional: He is oriented to person, place, and time. He appears well-developed.  HENT:  Head: Normocephalic.  Eyes: Pupils are equal, round, and reactive to light.  Neck: Normal range of motion.  Cardiovascular: Normal rate.   Respiratory: Effort normal.  GI: Soft.  Genitourinary:  Denies  any issues  Musculoskeletal: Normal range of motion.  Neurological: He is alert and oriented to person, place, and time.  Skin: Skin is warm and dry.    Review of Systems  Constitutional: Positive for chills, malaise/fatigue and diaphoresis.  HENT: Negative.   Eyes: Negative.   Respiratory: Negative.   Cardiovascular: Negative.   Gastrointestinal: Positive for nausea.  Genitourinary: Negative.   Musculoskeletal: Positive for myalgias.  Skin: Negative.   Neurological: Positive for dizziness, tremors and weakness.  Endo/Heme/Allergies: Negative.   Psychiatric/Behavioral: Positive for substance abuse. Negative for depression, suicidal ideas, hallucinations and memory loss. The patient is nervous/anxious and has insomnia.     Blood pressure 143/106, pulse 93, temperature 96.7 F (35.9 C), temperature source Oral, resp. rate 20, height 5' 6.75" (1.695 m), weight 63.957 kg (141 lb).Body mass index is 22.26 kg/(m^2).  General Appearance: Casual and uncomfortable, facial flushing  Eye Contact::  Poor  Speech:  Clear and Coherent  Volume:  Decreased  Mood:  Anxious  Affect:  Restricted  Thought Process:  Coherent and Goal Directed  Orientation:  Full (Time, Place, and Person)  Thought Content:  Denies any hallucinations, delusions, paranoia  Suicidal Thoughts:  No  Homicidal Thoughts:  No  Memory:  Immediate;   Good Recent;   Good Remote;   Good  Judgement:  Fair  Insight:  Present  Psychomotor Activity:  Restlessness  Concentration:  Fair  Recall:  Good  Fund of Knowledge:Fair  Language: Good  Akathisia:  No  Handed:  Right  AIMS (if indicated):     Assets:  Communication  Skills Desire for Improvement Leisure Time  Sleep:  Number of Hours: 3.5    Musculoskeletal: Strength & Muscle Tone: within normal limits Gait & Station: normal Patient leans: N/A  Past Psychiatric History: Diagnosis: Cannabis Use Disorder - Severe (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 Anxiety   . Substance abuse   . Heart palpitations   . Hypercholesteremia    Cardiac History:  Heart Palpitations, hypercholesteremia  Allergies:   Allergies  Allergen Reactions  . Bee Venom Anaphylaxis   PTA Medications: Prescriptions prior to admission  Medication Sig Dispense Refill  . ALPRAZolam (XANAX) 1 MG tablet Take 1 mg by mouth 3 (three) times daily. 1 tablet 3 times daily, make take one additional tablet at night as needed for anxiety and sleep      . clonazePAM (KLONOPIN) 2 MG tablet Take 2 mg by mouth 2 (two) times daily.      Marland Kitchen EPINEPHrine (EPI-PEN) 0.3 mg/0.3 mL DEVI Inject 0.3 mg into the muscle once as needed. If stung by bee      . [DISCONTINUED] ALPRAZolam (XANAX) 1 MG tablet 1 tablet 3 times daily, make take one additional tablet at night as needed for anxiety and sleep  100 tablet  0   Previous Psychotropic Medications: Medication/Dose  See medication lists               Substance Abuse History in the last 12 months:  yes  Take all your medications as prescribed by your mental healthcare provider. Report any adverse effects and or reactions from your medicines to your outpatient provider promptly. Patient is instructed and cautioned to not engage in alcohol and or illegal drug use while on prescription medicines. In the event of worsening symptoms, patient is instructed to call the crisis hotline, 911 and or go to the nearest ED for appropriate evaluation and treatment of symptoms. Follow-up with your primary care provider for your other medical issues, concerns and or health care needs.   Social History:  reports that he has been smoking.  He does not have any smokeless  tobacco history on file. He reports that he drinks about .5 ounces of alcohol per week. He reports that he uses illicit drugs (Amphetamines). Additional Social History: Pain Medications: none Prescriptions: xanax, klonopin Over the Counter: none reported History of alcohol / drug use?: Yes Longest period of sobriety (when/how long): five years 2007-2012 Negative Consequences of Use: Personal relationships Withdrawal Symptoms: Diarrhea;Nausea / Vomiting;Sweats;Cramps;Fever / Chills Name of Substance 1: etoh 1 - Age of First Use: unk 1 - Amount (size/oz): 1 pint 1 - Frequency:  daily 1 - Duration: 6 months 1 - Last Use / Amount: 05/31/14 1/2 pint of liquor Name of Substance 2: heroin 2 - Age of First Use: 22 2 - Amount (size/oz): 2 grams` 2 - Frequency: daily 2 - Duration: ongoing for 10 years 2 - Last Use / Amount: 05/30/14 Current Place of Residence: [place], [place]  Place of Birth: [place], [place]   Family Members: "My 2 children"  Marital Status:  Single  Children: 2  Sons: 1  Daughters: 1  Relationships: Single  Education:  [employer] Charity fundraiser Problems/Performance: Completed high school  Religious Beliefs/Practices: NA  History of  Abuse (Emotional/Phsycial/Sexual): Denies  Occupational Experiences: Medical laboratory scientific officer History:  None.  Legal History: Pending legal charge for possession, court date 07/07/14  Hobbies/Interests: NA  Family History:   Family History  Problem Relation Age of Onset  . Anxiety disorder Mother   . Stroke Paternal Grandmother   . Cancer Paternal Grandmother     Results for orders placed during the hospital encounter of 05/31/14 (from the past 72 hour(s))  CBC WITH DIFFERENTIAL     Status: Abnormal   Collection Time    05/31/14  6:53 PM      Result Value Ref Range   WBC 12.8 (*) 4.0 - 10.5 K/uL   RBC 5.24  4.22 - 5.81 MIL/uL   Hemoglobin 16.7  13.0 - 17.0 g/dL   HCT 48.2  39.0 - 52.0 %   MCV 92.0  78.0 - 100.0 fL    MCH 31.9  26.0 - 34.0 pg   MCHC 34.6  30.0 - 36.0 g/dL   RDW 12.4  11.5 - 15.5 %   Platelets 296  150 - 400 K/uL   Neutrophils Relative % 73  43 - 77 %   Neutro Abs 9.4 (*) 1.7 - 7.7 K/uL   Lymphocytes Relative 14  12 - 46 %   Lymphs Abs 1.8  0.7 - 4.0 K/uL   Monocytes Relative 13 (*) 3 - 12 %   Monocytes Absolute 1.6 (*) 0.1 - 1.0 K/uL   Eosinophils Relative 0  0 - 5 %   Eosinophils Absolute 0.0  0.0 - 0.7 K/uL   Basophils Relative 0  0 - 1 %   Basophils Absolute 0.0  0.0 - 0.1 K/uL  COMPREHENSIVE METABOLIC PANEL     Status: Abnormal   Collection Time    05/31/14  6:53 PM      Result Value Ref Range   Sodium 142  137 - 147 mEq/L   Potassium 3.5 (*) 3.7 - 5.3 mEq/L   Chloride 104  96 - 112 mEq/L   CO2 25  19 - 32 mEq/L   Glucose, Bld 102 (*) 70 - 99 mg/dL   BUN 5 (*) 6 - 23 mg/dL   Creatinine, Ser 0.90  0.50 - 1.35 mg/dL   Calcium 9.8  8.4 - 10.5 mg/dL   Total Protein 7.4  6.0 - 8.3 g/dL   Albumin 3.9  3.5 - 5.2 g/dL   AST 18  0 - 37 U/L   ALT 13  0 - 53 U/L   Alkaline Phosphatase 73  39 - 117 U/L   Total Bilirubin 0.2 (*) 0.3 - 1.2 mg/dL   GFR calc non Af Amer >90  >90 mL/min   GFR calc Af Amer >90  >90 mL/min   Comment: (NOTE)     The eGFR has been calculated using the CKD EPI equation.     This calculation has not been validated in all clinical situations.     eGFR's persistently <90 mL/min signify possible Chronic Kidney     Disease.  ETHANOL     Status: None   Collection Time    05/31/14  6:53 PM      Result Value Ref Range   Alcohol, Ethyl (B) <11  0 - 11 mg/dL   Comment:            LOWEST DETECTABLE LIMIT FOR     SERUM ALCOHOL IS 11 mg/dL     FOR MEDICAL PURPOSES ONLY  ACETAMINOPHEN LEVEL  Status: None   Collection Time    05/31/14  6:53 PM      Result Value Ref Range   Acetaminophen (Tylenol), Serum <15.0  10 - 30 ug/mL   Comment:            THERAPEUTIC CONCENTRATIONS VARY     SIGNIFICANTLY. A RANGE OF 10-30     ug/mL MAY BE AN EFFECTIVE      CONCENTRATION FOR MANY PATIENTS.     HOWEVER, SOME ARE BEST TREATED     AT CONCENTRATIONS OUTSIDE THIS     RANGE.     ACETAMINOPHEN CONCENTRATIONS     >150 ug/mL AT 4 HOURS AFTER     INGESTION AND >50 ug/mL AT 12     HOURS AFTER INGESTION ARE     OFTEN ASSOCIATED WITH TOXIC     REACTIONS.  SALICYLATE LEVEL     Status: Abnormal   Collection Time    05/31/14  6:53 PM      Result Value Ref Range   Salicylate Lvl <8.2 (*) 2.8 - 20.0 mg/dL  URINE RAPID DRUG SCREEN (HOSP PERFORMED)     Status: Abnormal   Collection Time    05/31/14  9:10 PM      Result Value Ref Range   Opiates POSITIVE (*) NONE DETECTED   Cocaine NONE DETECTED  NONE DETECTED   Benzodiazepines POSITIVE (*) NONE DETECTED   Amphetamines POSITIVE (*) NONE DETECTED   Tetrahydrocannabinol POSITIVE (*) NONE DETECTED   Barbiturates NONE DETECTED  NONE DETECTED   Comment:            DRUG SCREEN FOR MEDICAL PURPOSES     ONLY.  IF CONFIRMATION IS NEEDED     FOR ANY PURPOSE, NOTIFY LAB     WITHIN 5 DAYS.                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 Anxiety   . Substance abuse   . Heart palpitations   . Hypercholesteremia    AXIS IV:  other psychosocial or environmental problems and  Polysusbstance dependence AXIS V:  1-10 persistent dangerousness to self and others present  Treatment Plan/Recommendations: 1. Admit for crisis management and stabilization, estimated length of stay 3-5 days.  2. Medication management to reduce current symptoms to base line and improve the patient's overall level of functioning; continue current detox regimen in progress.  3. Treat health  problems as indicated.  4. Develop treatment plan to decrease risk of relapse upon discharge and the need for readmission.  5. Psycho-social education regarding relapse prevention and self care.  6. Health care follow up as needed for medical problems.  7. Review, reconcile, and reinstate any pertinent home medications for other health issues where appropriate. 8. Call for consults with hospitalist for any additional specialty patient care services as needed.  Treatment Plan Summary: Daily contact with patient to assess and evaluate symptoms and progress in treatment Medication management  Current Medications:  Current Facility-Administered Medications  Medication Dose Route Frequency Provider Last Rate Last Dose  . acetaminophen (TYLENOL) tablet 650 mg  650 mg Oral Q6H PRN Laverle Hobby, PA-C      . alum & mag hydroxide-simeth (MAALOX/MYLANTA) 200-200-20 MG/5ML suspension 30 mL  30 mL Oral Q4H PRN Laverle Hobby, PA-C      . chlordiazePOXIDE (LIBRIUM) capsule 25 mg  25 mg Oral Q6H PRN Laverle Hobby, PA-C   25 mg at 06/01/14 0630  . chlordiazePOXIDE (LIBRIUM) capsule 25 mg  25 mg Oral QID Laverle Hobby, PA-C   25 mg at 06/01/14 0757   Followed by  . [START ON 06/02/2014] chlordiazePOXIDE (LIBRIUM) capsule 25 mg  25 mg Oral TID Laverle Hobby, PA-C       Followed by  . [START ON 06/03/2014] chlordiazePOXIDE (LIBRIUM) capsule 25 mg  25 mg Oral BH-qamhs Spencer E Simon, PA-C       Followed by  . [START ON 06/05/2014] chlordiazePOXIDE (LIBRIUM) capsule 25 mg  25 mg Oral Daily Laverle Hobby, PA-C       . cloNIDine (CATAPRES) tablet 0.1 mg  0.1 mg Oral QID Laverle Hobby, PA-C   0.1 mg at 06/01/14 0757   Followed by  . [START ON 06/03/2014] cloNIDine (CATAPRES) tablet 0.1 mg  0.1 mg Oral BH-qamhs Spencer E Simon, PA-C       Followed by  . [START ON 06/06/2014] cloNIDine (CATAPRES) tablet 0.1 mg  0.1 mg Oral QAC breakfast Laverle Hobby, PA-C      . dicyclomine (BENTYL) tablet 20 mg  20 mg Oral Q6H PRN Laverle Hobby, PA-C      . hydrOXYzine (ATARAX/VISTARIL) tablet 25 mg  25 mg Oral Q6H PRN Laverle Hobby, PA-C   25 mg at 06/01/14 0630  . [START ON 06/02/2014] lisinopril (PRINIVIL,ZESTRIL) tablet 10 mg  10 mg Oral Daily Encarnacion Slates, NP      . lisinopril (PRINIVIL,ZESTRIL) tablet 20 mg  20 mg Oral Once Encarnacion Slates, NP      . loperamide (IMODIUM) capsule 2-4 mg  2-4 mg Oral PRN Laverle Hobby, PA-C   4 mg at 06/01/14 0630  . magnesium hydroxide (MILK OF MAGNESIA) suspension 30 mL  30 mL Oral Daily PRN Laverle Hobby, PA-C      . methocarbamol (ROBAXIN) tablet 500 mg  500 mg Oral Q8H PRN Laverle Hobby, PA-C   500 mg at 06/01/14 0630  . multivitamin with minerals tablet 1 tablet  1 tablet Oral Daily Laverle Hobby, PA-C   1 tablet at 06/01/14 0757  . naproxen (NAPROSYN) tablet 500 mg  500 mg Oral BID PRN Laverle Hobby, PA-C   500 mg at 06/01/14 0630  . nicotine (NICODERM CQ - dosed in mg/24 hours) patch 21 mg  21 mg Transdermal Q0600 Nicholaus Bloom, MD   21 mg at 06/01/14 0086  .  ondansetron (ZOFRAN-ODT) disintegrating tablet 4 mg  4 mg Oral Q6H PRN Laverle Hobby, PA-C      . thiamine (B-1) injection 100 mg  100 mg Intramuscular Once Spencer E Simon, PA-C      . [START ON 06/02/2014] thiamine (VITAMIN B-1) tablet 100 mg  100 mg Oral Daily Spencer E Simon, PA-C      . traZODone (DESYREL) tablet 50 mg  50 mg Oral QHS,MR X 1 Laverle Hobby, PA-C        Observation Level/Precautions:  15 minute checks  Laboratory:  Per ED  Psychotherapy: Group sessions, AA/NA meetings   Medications:   See medication lists  Consultations:  As needed  Discharge Concerns:  Maintaining sobriety  Estimated LOS: 2-4 days  Other:     I certify that inpatient services furnished can reasonably be expected to improve the patient's condition.   Encarnacion Slates, PMHNP-BC 6/24/20159:23 AM  I personally assessed the patient, reviewed the physical exam and labs and formulated the treatment plan Geralyn Flash A. Sabra Heck, M.D.

## 2014-06-01 NOTE — Progress Notes (Signed)
Did not attend group 

## 2014-06-01 NOTE — Progress Notes (Signed)
Patient ID: Frederick NeighboursChristopher J Frazee, male   DOB: [birth year], 33 y.o.   MRN: [id number] He has been in bed most of the day except to see the MD. To get medication and for meals.  He c/o diarrhea and received Imodium that has been effective and tylenol generalized body aches. He refused to fill out his self inventory.

## 2014-06-01 NOTE — [facility] Suicide Risk Assessment (Signed)
Suicide Risk Assessment  Admission Assessment     Nursing information obtained from:  Patient Demographic factors:  Male;Adolescent or young adult;Caucasian;Unemployed Current Mental Status:  NA Loss Factors:    Historical Factors:    Risk Reduction Factors:    Total Time spent with patient: 45 minutes  CLINICAL FACTORS:   Panic Attacks Depression:   Comorbid alcohol abuse/dependence Alcohol/Substance Abuse/Dependencies  Psychiatric Specialty Exam:     Blood pressure 121/80, pulse 88, temperature 98.3 F (36.8 C), temperature source Oral, resp. rate 20, height 5' 6.75" (1.695 m), weight 63.957 kg (141 lb).Body mass index is 22.26 kg/(m^2).  General Appearance: Disheveled  Eye SolicitorContact::  Fair  Speech:  Clear and Coherent  Volume:  Decreased  Mood:  Anxious, Depressed and worried  Affect:  Restricted  Thought Process:  Coherent and Goal Directed  Orientation:  Full (Time, Place, and Person)  Thought Content:  symptoms, worries, concerns  Suicidal Thoughts:  No  Homicidal Thoughts:  No  Memory:  Immediate;   Fair Recent;   Fair Remote;   Fair  Judgement:  Fair  Insight:  Present  Psychomotor Activity:  Restlessness  Concentration:  Fair  Recall:  FiservFair  Fund of Knowledge:NA  Language: Fair  Akathisia:  No  Handed:    AIMS (if indicated):     Assets:  Desire for Improvement Social Support  Sleep:  Number of Hours: 3.5   Musculoskeletal: Strength & Muscle Tone: within normal limits Gait & Station: normal Patient leans: N/A  COGNITIVE FEATURES THAT CONTRIBUTE TO RISK:  Closed-mindedness    SUICIDE RISK:   Moderate:   PLAN OF CARE: Supportive approach/coping skills/relapse prevention                               Detox/reassess and address the co morbidities  I certify that inpatient services furnished can reasonably be expected to improve the patient's condition.  LUGO,IRVING A 06/01/2014, 4:19 PM
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
## 2014-06-01 NOTE — Progress Notes (Signed)
NUTRITION ASSESSMENT  Pt identified as at risk on the Malnutrition Screen Tool Patient meets criteria for moderate malnutrition related to social and environmental criteria AEB weight loss of 12% in greater that 1 month, decreased muscle mass and body fat.  INTERVENTION: 1. Educated patient on the importance of nutrition and encouraged intake of food and beverages. 2. Discussed weight goals. 3. Supplements: MVI and thiamine. Ensure Complete po BID, each supplement provides 350 kcal and 13 grams of protein   NUTRITION DIAGNOSIS: Unintentional weight loss related to sub-optimal intake as evidenced by pt report.   Goal: Pt to meet >/= 90% of their estimated nutrition needs.  Monitor:  PO intake  Assessment:  Patient admitted for etoh and heroine detox. (Opiod, benzodiazepine, cannabis dependence.)  Patient reports poor intake currently due to detox and poor prior to admit secondary to drugs.  UBW 160 lbs "about a week ago".  Patient is 88% IBW.   33 y.o. male  Height: Ht Readings from Last 1 Encounters:  06/01/14 5' 6.75" (1.695 m)    Weight: Wt Readings from Last 1 Encounters:  06/01/14 141 lb (63.957 kg)    Weight Hx: Wt Readings from Last 10 Encounters:  06/01/14 141 lb (63.957 kg)  01/04/12 160 lb (72.576 kg)  09/26/11 163 lb 12.8 oz (74.299 kg)  06/11/11 152 lb 12.8 oz (69.31 kg)  06/07/11 154 lb 12.8 oz (70.217 kg)  09/03/10 147 lb (66.679 kg)  06/05/10 143 lb 6.4 oz (65.046 kg)  09/01/09 145 lb 3.2 oz (65.862 kg)  08/08/09 150 lb (68.04 kg)  06/08/09 157 lb 2.1 oz (71.274 kg)    BMI:  Body mass index is 22.26 kg/(m^2). Pt meets criteria for normal weight based on current BMI.  Estimated Nutritional Needs: Kcal: 25-30 kcal/kg Protein: > 1 gram protein/kg Fluid: 1 ml/kcal  Diet Order: General Pt is also offered choice of unit snacks mid-morning and mid-afternoon.  Pt is eating as desired.   Lab results and medications reviewed.   Oran ReinLaura Jobe, RD,  LDN Clinical Inpatient Dietitian Pager:  [phone] Weekend and after hours pager:  [phone]

## 2014-06-01 NOTE — ED Provider Notes (Signed)
Medical screening examination/treatment/procedure(s) were performed by non-physician practitioner and as supervising physician I was immediately available for consultation/collaboration.   EKG Interpretation None        Junius ArgyleForrest S Harrison, MD 06/01/14 [id number]

## 2014-06-01 NOTE — Progress Notes (Signed)
D Pt. Denies SI and HI, but reports a rough day d/t withdrawals.    A Writer offers support and encouragement,  Discussed day with pt. And offered prn med for withdrawals and diarrhea.  R Pt. Remains safe on the unit,   Pt. Is presently resting quietly.

## 2014-06-01 NOTE — Progress Notes (Signed)
Patient ID: Frederick NeighboursChristopher J Mcquaid, male   DOB: Dec 17, 1980, 33 y.o.   MRN: [id number]  D: Pt was laying in bed during the assessment. Stated he hasn't been feeling well today. Pt informed the writer that this is his first I/P treatment. When asked what made him decide to seek help today pt stated, "I'm tired of living like this". Writer discussed detoxing with the pt and informed that staff would do their best to make sure it goes as easy as possible for him. But also informed that there may be times that he feels uncomfortable, but too inform the staff.   A:  Support and encouragement was offered. 15 min checks continued for safety.  R: Pt remains safe.

## 2014-06-01 NOTE — Tx Team (Signed)
Initial Interdisciplinary Treatment Plan  PATIENT STRENGTHS: (choose at least two) Ability for insight Average or above average intelligence Capable of independent living General fund of knowledge Supportive family/friends  PATIENT STRESSORS: Financial difficulties Marital or family conflict Substance abuse   PROBLEM LIST: Problem List/Patient Goals Date to be addressed Date deferred Reason deferred Estimated date of resolution  anxiety      Heroin abuse      Alcohol dependence                                           DISCHARGE CRITERIA:  Ability to meet basic life and health needs Improved stabilization in mood, thinking, and/or behavior Motivation to continue treatment in a less acute level of care Reduction of life-threatening or endangering symptoms to within safe limitsa  PRELIMINARY DISCHARGE PLAN: Attend 12-step recovery group Outpatient therapy Return to previous living arrangement Return to previous work or school arrangements  PATIENT/FAMIILY INVOLVEMENT: This treatment plan has been presented to and reviewed with the patient, Darvin NeighboursChristopher J Mattie, and/or family member,  The patient and family have been given the opportunity to ask questions and make suggestions.  Loura HaltGoodman, Barbara K 06/01/2014, 1:23 AM

## 2014-06-02 DIAGNOSIS — F410 Panic disorder [episodic paroxysmal anxiety] without agoraphobia: Secondary | ICD-10-CM

## 2014-06-02 DIAGNOSIS — F1994 Other psychoactive substance use, unspecified with psychoactive substance-induced mood disorder: Secondary | ICD-10-CM

## 2014-06-02 MED ORDER — GABAPENTIN 300 MG PO CAPS
300.0000 mg | ORAL_CAPSULE | Freq: Three times a day (TID) | ORAL | Status: DC
  Administered 2014-06-02 – 2014-06-07 (×16): 300 mg via ORAL
  Filled 2014-06-02: qty 42
  Filled 2014-06-02 (×7): qty 1
  Filled 2014-06-02: qty 42
  Filled 2014-06-02 (×3): qty 1
  Filled 2014-06-02: qty 42
  Filled 2014-06-02 (×7): qty 1

## 2014-06-02 MED ORDER — QUETIAPINE FUMARATE 25 MG PO TABS
25.0000 mg | ORAL_TABLET | Freq: Three times a day (TID) | ORAL | Status: DC
  Administered 2014-06-02 – 2014-06-07 (×17): 25 mg via ORAL
  Filled 2014-06-02 (×3): qty 1
  Filled 2014-06-02: qty 42
  Filled 2014-06-02 (×9): qty 1
  Filled 2014-06-02: qty 42
  Filled 2014-06-02 (×4): qty 1
  Filled 2014-06-02: qty 42
  Filled 2014-06-02 (×3): qty 1

## 2014-06-02 NOTE — Progress Notes (Signed)
Patient ID: Frederick NeighboursChristopher J [name], male   DOB: 07/30/1981, 33 y.o.   MRN: [id number] has been up for medication and was encouraged to go to AA meeting witch he did. He has c/o feeling weak and tired. Has not requested any prn medications. Self Inventory: depression 3 hopeless 3 , withdrawals of craving, tremors diarrhea, chilling and he denies SI thoughts.

## 2014-06-02 NOTE — Progress Notes (Signed)
[facility] MD Progress Note  06/02/2014 4:38 PM Frederick Frederick [name]  MRN:  [id number] Subjective:  Frederick Frederick [name] endorses that he continues to have Frederick [name] hard time. He is having Frederick [name] lot of aches and pains and Frederick [name] lot of anxiety. States that his panic attacks come from no where. He has used Benzodiazepines prescribed and getting them out there to control the panic. States the panic gets in the way of his ability to function. He admits that he he has not have any significant sobriety. The only time he was not using he was on Methdone Diagnosis:   DSM5: Schizophrenia Disorders:  none Obsessive-Compulsive Disorders:  none Trauma-Stressor Disorders:  none Substance/Addictive Disorders:  Alcohol Related Disorder - Severe (303.90) and Opioid Disorder - Severe (304.00), Benzodiazepine related disorder Depressive Disorders:  Major Depressive Disorder - Moderate (296.22) Total Time spent with patient: 30 minutes  Axis I: Panic Disorder and Substance Induced Mood Disorder  ADL's:  Intact  Sleep: Fair  Appetite:  Fair  Suicidal Ideation:  Plan:  denies Intent:  denies Means:  denies Homicidal Ideation:  Plan:  denies Intent:  denies Means:  denies AEB (as evidenced by):  Psychiatric Specialty Exam: Physical Exam  Review of Systems  Constitutional: Positive for malaise/fatigue.  HENT: Negative.   Eyes: Negative.   Respiratory: Negative.   Cardiovascular: Negative.   Gastrointestinal: Negative.   Genitourinary: Negative.   Musculoskeletal: Positive for back pain, joint pain and myalgias.  Skin: Negative.   Neurological: Positive for weakness.  Endo/Heme/Allergies: Negative.   Psychiatric/Behavioral: Positive for substance abuse. The patient is nervous/anxious.     Blood pressure 100/65, pulse 66, temperature 97.3 F (36.3 C), temperature source Oral, resp. rate 18, height 5' 6.75" (1.695 m), weight 63.957 kg (141 lb).Body mass index is 22.26 kg/(m^2).  General Appearance: Fairly Groomed  Chemical engineer::  Fair  Speech:  Clear and Coherent  Volume:  Normal  Mood:  Anxious and worried, with aches and pains  Affect:  Restricted  Thought Process:  Coherent and Goal Directed  Orientation:  Full (Time, Place, and Person)  Thought Content:  symtpoms, worries, concerns  Suicidal Thoughts:  No  Homicidal Thoughts:  No  Memory:  Immediate;   Fair Recent;   Fair Remote;   Fair  Judgement:  Fair  Insight:  Present  Psychomotor Activity:  Restlessness  Concentration:  Fair  Recall:  [employer] of Knowledge:NA  Language: Fair  Akathisia:  No  Handed:    AIMS (if indicated):     Assets:  Desire for Improvement  Sleep:  Number of Hours: 6.75   Musculoskeletal: Strength & Muscle Tone: within normal limits Gait & Station: normal Patient leans: N/Frederick [name]  Current Medications: Current Facility-Administered Medications  Medication Dose Route Frequency Provider Last Rate Last Dose  . acetaminophen (TYLENOL) tablet 650 mg  650 mg Oral Q6H PRN Laverle Hobby, PA-C   650 mg at 06/01/14 1141  . alum & mag hydroxide-simeth (MAALOX/MYLANTA) 200-200-20 MG/5ML suspension 30 mL  30 mL Oral Q4H PRN Laverle Hobby, PA-C      . chlordiazePOXIDE (LIBRIUM) capsule 25 mg  25 mg Oral Q6H PRN Laverle Hobby, PA-C   25 mg at 06/01/14 1521  . chlordiazePOXIDE (LIBRIUM) capsule 25 mg  25 mg Oral TID Laverle Hobby, PA-C       Followed by  . [START ON 06/03/2014] chlordiazePOXIDE (LIBRIUM) capsule 25 mg  25 mg Oral BH-qamhs Laverle Hobby, PA-C  Followed by  . [START ON 06/05/2014] chlordiazePOXIDE (LIBRIUM) capsule 25 mg  25 mg Oral Daily Laverle Hobby, PA-C      . cloNIDine (CATAPRES) tablet 0.1 mg  0.1 mg Oral QID Laverle Hobby, PA-C   0.1 mg at 06/01/14 1712   Followed by  . [START ON 06/03/2014] cloNIDine (CATAPRES) tablet 0.1 mg  0.1 mg Oral BH-qamhs Spencer E Simon, PA-C       Followed by  . [START ON 06/06/2014] cloNIDine (CATAPRES) tablet 0.1 mg  0.1 mg Oral QAC breakfast Laverle Hobby,  PA-C      . dicyclomine (BENTYL) tablet 20 mg  20 mg Oral Q6H PRN Laverle Hobby, PA-C   20 mg at 06/02/14 1156  . feeding supplement (ENSURE COMPLETE) (ENSURE COMPLETE) liquid 237 mL  237 mL Oral BID BM Darrol Jump, RD   237 mL at 06/02/14 1004  . gabapentin (NEURONTIN) capsule 300 mg  300 mg Oral TID Nicholaus Bloom, MD      . hydrOXYzine (ATARAX/VISTARIL) tablet 25 mg  25 mg Oral Q6H PRN Laverle Hobby, PA-C   25 mg at 06/01/14 0630  . lisinopril (PRINIVIL,ZESTRIL) tablet 10 mg  10 mg Oral Daily Encarnacion Slates, NP   10 mg at 06/02/14 0805  . loperamide (IMODIUM) capsule 2-4 mg  2-4 mg Oral PRN Laverle Hobby, PA-C   2 mg at 06/01/14 1521  . magnesium hydroxide (MILK OF MAGNESIA) suspension 30 mL  30 mL Oral Daily PRN Laverle Hobby, PA-C      . methocarbamol (ROBAXIN) tablet 500 mg  500 mg Oral Q8H PRN Laverle Hobby, PA-C   500 mg at 06/02/14 2229  . multivitamin with minerals tablet 1 tablet  1 tablet Oral Daily Laverle Hobby, PA-C   1 tablet at 06/02/14 7989  . naproxen (NAPROSYN) tablet 500 mg  500 mg Oral BID PRN Laverle Hobby, PA-C   500 mg at 06/02/14 2119  . nicotine (NICODERM CQ - dosed in mg/24 hours) patch 21 mg  21 mg Transdermal Q0600 Nicholaus Bloom, MD   21 mg at 06/02/14 0656  . ondansetron (ZOFRAN-ODT) disintegrating tablet 4 mg  4 mg Oral Q6H PRN Laverle Hobby, PA-C      . QUEtiapine (SEROQUEL) tablet 25 mg  25 mg Oral TID Nicholaus Bloom, MD   25 mg at 06/02/14 1451  . thiamine (B-1) injection 100 mg  100 mg Intramuscular Once [employer], PA-C      . thiamine (VITAMIN B-1) tablet 100 mg  100 mg Oral Daily Laverle Hobby, PA-C   100 mg at 06/02/14 4174  . traZODone (DESYREL) tablet 50 mg  50 mg Oral QHS,MR X 1 Laverle Hobby, PA-C   50 mg at 06/01/14 2208    Lab Results:  Results for orders placed during the hospital encounter of 05/31/14 (from the past 48 hour(s))  CBC WITH DIFFERENTIAL     Status: Abnormal   Collection Time    05/31/14  6:53 PM      Result  Value Ref Range   WBC 12.8 (*) 4.0 - 10.5 K/uL   RBC 5.24  4.22 - 5.81 MIL/uL   Hemoglobin 16.7  13.0 - 17.0 g/dL   HCT 48.2  39.0 - 52.0 %   MCV 92.0  78.0 - 100.0 fL   MCH 31.9  26.0 - 34.0 pg   MCHC 34.6  30.0 - 36.0 g/dL  RDW 12.4  11.5 - 15.5 %   Platelets 296  150 - 400 K/uL   Neutrophils Relative % 73  43 - 77 %   Neutro Abs 9.4 (*) 1.7 - 7.7 K/uL   Lymphocytes Relative 14  12 - 46 %   Lymphs Abs 1.8  0.7 - 4.0 K/uL   Monocytes Relative 13 (*) 3 - 12 %   Monocytes Absolute 1.6 (*) 0.1 - 1.0 K/uL   Eosinophils Relative 0  0 - 5 %   Eosinophils Absolute 0.0  0.0 - 0.7 K/uL   Basophils Relative 0  0 - 1 %   Basophils Absolute 0.0  0.0 - 0.1 K/uL  COMPREHENSIVE METABOLIC PANEL     Status: Abnormal   Collection Time    05/31/14  6:53 PM      Result Value Ref Range   Sodium 142  137 - 147 mEq/L   Potassium 3.5 (*) 3.7 - 5.3 mEq/L   Chloride 104  96 - 112 mEq/L   CO2 25  19 - 32 mEq/L   Glucose, Bld 102 (*) 70 - 99 mg/dL   BUN 5 (*) 6 - 23 mg/dL   Creatinine, Ser 0.90  0.50 - 1.35 mg/dL   Calcium 9.8  8.4 - 10.5 mg/dL   Total Protein 7.4  6.0 - 8.3 g/dL   Albumin 3.9  3.5 - 5.2 g/dL   AST 18  0 - 37 U/L   ALT 13  0 - 53 U/L   Alkaline Phosphatase 73  39 - 117 U/L   Total Bilirubin 0.2 (*) 0.3 - 1.2 mg/dL   GFR calc non Af Amer >90  >90 mL/min   GFR calc Af Amer >90  >90 mL/min   Comment: (NOTE)     The eGFR has been calculated using the CKD EPI equation.     This calculation has not been validated in all clinical situations.     eGFR's persistently <90 mL/min signify possible Chronic Kidney     Disease.  ETHANOL     Status: None   Collection Time    05/31/14  6:53 PM      Result Value Ref Range   Alcohol, Ethyl (B) <11  0 - 11 mg/dL   Comment:            LOWEST DETECTABLE LIMIT FOR     SERUM ALCOHOL IS 11 mg/dL     FOR MEDICAL PURPOSES ONLY  ACETAMINOPHEN LEVEL     Status: None   Collection Time    05/31/14  6:53 PM      Result Value Ref Range    Acetaminophen (Tylenol), Serum <15.0  10 - 30 ug/mL   Comment:            THERAPEUTIC CONCENTRATIONS VARY     SIGNIFICANTLY. Frederick [name] RANGE OF 10-30     ug/mL MAY BE AN EFFECTIVE     CONCENTRATION FOR MANY PATIENTS.     HOWEVER, SOME ARE BEST TREATED     AT CONCENTRATIONS OUTSIDE THIS     RANGE.     ACETAMINOPHEN CONCENTRATIONS     >150 ug/mL AT 4 HOURS AFTER     INGESTION AND >50 ug/mL AT 12     HOURS AFTER INGESTION ARE     OFTEN ASSOCIATED WITH TOXIC     REACTIONS.  SALICYLATE LEVEL     Status: Abnormal   Collection Time    05/31/14  6:53 PM  Result Value Ref Range   Salicylate Lvl <2.0 (*) 2.8 - 20.0 mg/dL  URINE RAPID DRUG SCREEN (HOSP PERFORMED)     Status: Abnormal   Collection Time    05/31/14  9:10 PM      Result Value Ref Range   Opiates POSITIVE (*) NONE DETECTED   Cocaine NONE DETECTED  NONE DETECTED   Benzodiazepines POSITIVE (*) NONE DETECTED   Amphetamines POSITIVE (*) NONE DETECTED   Tetrahydrocannabinol POSITIVE (*) NONE DETECTED   Barbiturates NONE DETECTED  NONE DETECTED   Comment:            DRUG SCREEN FOR MEDICAL PURPOSES     ONLY.  IF CONFIRMATION IS NEEDED     FOR ANY PURPOSE, NOTIFY LAB     WITHIN 5 DAYS.                LOWEST DETECTABLE LIMITS     FOR URINE DRUG SCREEN     Drug Class       Cutoff (ng/mL)     Amphetamine      1000     Barbiturate      200     Benzodiazepine   601     Tricyclics       561     Opiates          300     Cocaine          300     THC              50    Physical Findings: AIMS: Facial and Oral Movements Muscles of Facial Expression: None, normal Lips and Perioral Area: None, normal Jaw: None, normal Tongue: None, normal,Extremity Movements Upper (arms, wrists, hands, fingers): None, normal Lower (legs, knees, ankles, toes): None, normal, Trunk Movements Neck, shoulders, hips: None, normal, Overall Severity Severity of abnormal movements (highest score from questions above): None, normal Incapacitation due to  abnormal movements: None, normal Patient's awareness of abnormal movements (rate only patient's report): No Awareness, Dental Status Current problems with teeth and/or dentures?: No Does patient usually wear dentures?: No  CIWA:  CIWA-Ar Total: 4 COWS:  COWS Total Score: 5  Treatment Plan Summary: Daily contact with patient to assess and evaluate symptoms and progress in treatment Medication management  Plan: Supportive approach/coping skills/relapse prevention           Continue detox           Trial with Seroquel/Neurontin  Medical Decision Making Problem Points:  Established problem, worsening (2) and Review of psycho-social stressors (1) Data Points:  Review of new medications or change in dosage (2)  I certify that inpatient services furnished can reasonably be expected to improve the patient's condition.   Frederick Frederick [name],Frederick Frederick [name] 06/02/2014, 4:38 PM

## 2014-06-02 NOTE — Progress Notes (Signed)
Patient did not attend the evening karaoke group. Pt was notified that group was beginning but reported not feeling well enough to go and remained in bed.

## 2014-06-02 NOTE — Progress Notes (Signed)
Patient ID: Frederick [name], male   DOB: 11-23-1981, 33 y.o.   MRN: [id number] D: Patient has been sleeping most of this afternoon.  Had to get patient up in order to move him to another room.  He states he is still having a difficult time with his withdrawals and anxiety.  He appears anxious and depressed.  He is complaining of fatigue and weakness.  Patient had missed his dose of librium and clonidine due to sleeping.  He received them at 1910.  He also requested seroquel prn for his anxiety.  A: Continue to monitor medication management and MD orders.  Safety checks completed every 15 minutes per protocol.  R: Patient is receptive to staff; his behavior is appropriate to situation.

## 2014-06-02 NOTE — [facility] Group Notes (Signed)
0900 nursing orientation group   The focus of this group is to educate the patient on the purpose and policies of crisis stabilization and provide a format to answer questions about their admission.  The group details unit policies and expectations of patients while admitted.   Pt did not attend this group he was not feeling well per his Nurse Deeann Creeonna S. RN

## 2014-06-02 NOTE — [facility] Group Notes (Signed)
[facility] LCSW Group Therapy  06/02/2014 2:40 PM  Type of Therapy:  Group Therapy  Participation Level:  Minimal  Participation Quality:  Attentive  Affect:  Appropriate  Cognitive:  Alert and Oriented  Insight:  Improving  Engagement in Therapy:  Limited  Modes of Intervention:  Confrontation, Discussion, Education, Exploration, Problem-solving, Rapport Building, Socialization and Support  Summary of Progress/Problems:  Finding Balance in Life. Today's group focused on defining balance in one's own words, identifying things that can knock one off balance, and exploring healthy ways to maintain balance in life. Group members were asked to provide an example of a time when they felt off balance, describe how they handled that situation,and process healthier ways to regain balance in the future. Group members were asked to share the most important tool for maintaining balance that they learned [facility] and how they plan to apply this method after discharge. [name] was attentive and engaged throughout today's therapy group. He stated that he is unsure about what he plans to do from here but is working on getting his mind clear while in detox. [name] shows some progress in the group setting with limited insight aeb his difficulty in identifying short term goals. He was attentive and actively listened as others shared during group.    Smart, Heather LCSWA  06/02/2014, 2:40 PM

## 2014-06-03 MED ORDER — ENSURE COMPLETE PO LIQD
237.0000 mL | Freq: Three times a day (TID) | ORAL | Status: DC
  Administered 2014-06-03 – 2014-06-05 (×6): 237 mL via ORAL

## 2014-06-03 NOTE — Progress Notes (Signed)
Patient ID: Frederick Young, male   DOB: 1981/12/07, 33 y.o.   MRN: [id number]  D: Patient in bed a lot today. Reports doesn't feel well and feels really weak. Gatorade given today. Remains on librium and clonidine protocol. Reports some depression but denies any SI at present. Asked to fill out self inventory at some point today. Vital signs stable today. A: Staff will monitor on q 15 minute checks, follow treatment plan , and give meds as ordered. R: Cooperative on the unit.

## 2014-06-03 NOTE — [facility] Group Notes (Signed)
[facility] LCSW Group Therapy  06/03/2014 2:54 PM  Type of Therapy:  Group Therapy  Participation Level:  Did Not Attend-pt in bed resting during group. Pt reports sever withdrawal symptoms.   Smart, Heather LCSWA  06/03/2014, 2:54 PM

## 2014-06-03 NOTE — Progress Notes (Signed)
[facility]  06/03/2014 4:14 PM Darvin NeighboursChristopher J Throne  MRN:  [id number] Subjective: Frederick DeerChristopher states he is not doing well. He is feeling weak, his legs feel heavy. Has seen some benefit from the Seroquel and the Neurontin but is still experiencing anxiety. States he really wants to make this work for him. States he does not want to be going trough this over and over again.  Diagnosis:   DSM5: Schizophrenia Disorders:  none Obsessive-Compulsive Disorders:  none Trauma-Stressor Disorders:  none Substance/Addictive Disorders:  Alcohol Related Disorder - Severe (303.90) and Opioid Disorder - Severe (304.00), Benzodiazepine Dependence Depressive Disorders:  Major Depressive Disorder - Moderate (296.22) Total Time spent with patient: 30 minutes  Axis I: Panic Disorder and Substance Induced Mood Disorder  ADL's:  Intact  Sleep: Poor  Appetite:  Poor  Suicidal Ideation:  Plan:  denies Intent:  denies Means:  denies Homicidal Ideation:  Plan:  denies Intent:  denies Means:  denies AEB (as evidenced by):  Psychiatric Specialty Exam: Physical Exam  Review of Systems  Constitutional: Positive for malaise/fatigue.  HENT: Negative.   Eyes: Negative.   Respiratory: Negative.   Cardiovascular: Negative.   Gastrointestinal: Negative.   Musculoskeletal: Positive for myalgias.  Skin: Negative.   Neurological: Positive for dizziness and weakness.  Endo/Heme/Allergies: Negative.   Psychiatric/Behavioral: Positive for depression and substance abuse. The patient is nervous/anxious and has insomnia.     Blood pressure 114/69, pulse 93, temperature 96.6 F (35.9 C), temperature source Oral, resp. rate 16, height 5' 6.75" (1.695 m), weight 63.957 kg (141 lb).Body mass index is 22.26 kg/(m^2).  General Appearance: Disheveled  Eye Contact::  Minimal  Speech:  Clear and Coherent, Slow and not spontaneous  Volume:  Decreased  Mood:  Anxious and Depressed  Affect:  Restricted   Thought Process:  Coherent and Goal Directed  Orientation:  Full (Time, Place, and Person)  Thought Content:  symptoms, worries, concerns  Suicidal Thoughts:  No  Homicidal Thoughts:  No  Memory:  Immediate;   Fair Recent;   Fair Remote;   Fair  Judgement:  Fair  Insight:  Present  Psychomotor Activity:  Restlessness  Concentration:  Fair  Recall:  FiservFair  Fund of Knowledge:NA  Language: Fair  Akathisia:  No  Handed:    AIMS (if indicated):     Assets:  Desire for Improvement  Sleep:  Number of Hours: 5   Musculoskeletal: Strength & Muscle Tone: within normal limits Gait & Station: normal Patient leans: N/A  Current Medications: Current Facility-Administered Medications  Medication Dose Route Frequency Provider Last Rate Last Dose  . acetaminophen (TYLENOL) tablet 650 mg  650 mg Oral Q6H PRN Kerry HoughSpencer E Simon, PA-C   650 mg at 06/01/14 1141  . alum & mag hydroxide-simeth (MAALOX/MYLANTA) 200-200-20 MG/5ML suspension 30 mL  30 mL Oral Q4H PRN Kerry HoughSpencer E Simon, PA-C      . chlordiazePOXIDE (LIBRIUM) capsule 25 mg  25 mg Oral Q6H PRN Kerry HoughSpencer E Simon, PA-C   25 mg at 06/01/14 1521  . chlordiazePOXIDE (LIBRIUM) capsule 25 mg  25 mg Oral BH-qamhs Spencer E Simon, PA-C      . cloNIDine (CATAPRES) tablet 0.1 mg  0.1 mg Oral QID Kerry HoughSpencer E Simon, PA-C   0.1 mg at 06/03/14 1209  . dicyclomine (BENTYL) tablet 20 mg  20 mg Oral Q6H PRN Kerry HoughSpencer E Simon, PA-C   20 mg at 06/02/14 1156  . feeding supplement (ENSURE COMPLETE) (ENSURE COMPLETE) liquid 237 mL  237  mL Oral TID BM Rachael FeeIrving A Lugo, MD   237 mL at 06/03/14 1434  . gabapentin (NEURONTIN) capsule 300 mg  300 mg Oral TID Rachael FeeIrving A Lugo, MD   300 mg at 06/03/14 1209  . hydrOXYzine (ATARAX/VISTARIL) tablet 25 mg  25 mg Oral Q6H PRN Kerry HoughSpencer E Simon, PA-C   25 mg at 06/03/14 0650  . lisinopril (PRINIVIL,ZESTRIL) tablet 10 mg  10 mg Oral Daily Sanjuana KavaAgnes I Nwoko, NP   10 mg at 06/03/14 0834  . loperamide (IMODIUM) capsule 2-4 mg  2-4 mg Oral PRN Kerry HoughSpencer  E Simon, PA-C   2 mg at 06/01/14 1521  . magnesium hydroxide (MILK OF MAGNESIA) suspension 30 mL  30 mL Oral Daily PRN Kerry HoughSpencer E Simon, PA-C      . methocarbamol (ROBAXIN) tablet 500 mg  500 mg Oral Q8H PRN Kerry HoughSpencer E Simon, PA-C   500 mg at 06/02/14 [id number]  . multivitamin with minerals tablet 1 tablet  1 tablet Oral Daily Kerry HoughSpencer E Simon, PA-C   1 tablet at 06/03/14 [id number]  . naproxen (NAPROSYN) tablet 500 mg  500 mg Oral BID PRN Kerry HoughSpencer E Simon, PA-C   500 mg at 06/02/14 [id number]  . nicotine (NICODERM CQ - dosed in mg/24 hours) patch 21 mg  21 mg Transdermal Q0600 Rachael FeeIrving A Lugo, MD   21 mg at 06/03/14 0837  . ondansetron (ZOFRAN-ODT) disintegrating tablet 4 mg  4 mg Oral Q6H PRN Kerry HoughSpencer E Simon, PA-C      . QUEtiapine (SEROQUEL) tablet 25 mg  25 mg Oral TID Rachael FeeIrving A Lugo, MD   25 mg at 06/03/14 1209  . thiamine (B-1) injection 100 mg  100 mg Intramuscular Once IntelSpencer E Simon, PA-C      . thiamine (VITAMIN B-1) tablet 100 mg  100 mg Oral Daily Kerry HoughSpencer E Simon, PA-C   100 mg at 06/03/14 [id number]  . traZODone (DESYREL) tablet 50 mg  50 mg Oral QHS,MR X 1 Spencer E Simon, PA-C   50 mg at 06/02/14 2300    Lab Results: No results found for this or any previous visit (from the past 48 hour(s)).  Physical Findings: AIMS: Facial and Oral Movements Muscles of Facial Expression: None, normal Lips and Perioral Area: None, normal Jaw: None, normal Tongue: None, normal,Extremity Movements Upper (arms, wrists, hands, fingers): None, normal Lower (legs, knees, ankles, toes): None, normal, Trunk Movements Neck, shoulders, hips: None, normal, Overall Severity Severity of abnormal movements (highest score from questions above): None, normal Incapacitation due to abnormal movements: None, normal Patient's awareness of abnormal movements (rate only patient's report): No Awareness, Dental Status Current problems with teeth and/or dentures?: No Does patient usually wear dentures?: No  CIWA:  CIWA-Ar Total: 4 COWS:   COWS Total Score: 2  Treatment Plan Summary: Daily contact with patient to assess and evaluate symptoms and progress in treatment Medication management  Plan: Supportive approach/coping skills/relapse prevention           Continue trial with Neurontin/Seroquel and optmize response           Will D/C the clonidine Medical Decision Making Problem Points:  Established problem, worsening (2) and Review of psycho-social stressors (1) Data Points:  Review of medication regiment & side effects (2) Review of new medications or change in dosage (2)  I certify that inpatient services furnished can reasonably be expected to improve the patient's condition.   LUGO,IRVING A 06/03/2014, 4:14 PM

## 2014-06-03 NOTE — [facility] Group Notes (Signed)
[facility]   06/03/2014 10:44 AM  Participation Quality:  Minimal   Mood/Affect:  Depressed and Flat  Depression Rating:  0  Anxiety Rating:  8  Thoughts of Suicide:  No Will you contract for safety?   NA  Current AVH:  No  Plan for Discharge/Comments:  Pt unsure about what he wants in terms of aftercare. CSW reviewed both inpatient and outpatient options with pt. He is heavily focused on severity of withdrawal symptoms and stated that he and MD are working to adjust meds.   Transportation Means: unknown at this time.   Supports: "my mom and dad."   Smart, OncologistHeather LCSWA

## 2014-06-03 NOTE — Tx Team (Signed)
Interdisciplinary Treatment Plan Update (Adult)  Date: 06/03/2014   Time Reviewed: 10:46 AM  Progress in Treatment:  Attending groups: Yes  Participating in groups:  Minimally  Taking medication as prescribed: Yes  Tolerating medication: Yes  Family/Significant othe contact made: Not needed. SPE not required for this pt.   Patient understands diagnosis: Yes, AEB seeking treatment for ETOH detox/heroin detox, mood stabilization, and med management.  Discussing patient identified problems/goals with staff: Yes  Medical problems stabilized or resolved: Yes  Denies suicidal/homicidal ideation: Yes during admission, group, and self report.  Patient has not harmed self or Others: Yes  New problem(s) identified:  Discharge Plan or Barriers: Pt having difficulty making his mind up regarding inpatient vs outpatient treatment. CSW reviewed all available options with pt. His primary focus is on severity of withdrawal symptoms at this time. MD aware.  Additional comments: Frederick Young is 33 years old, Caucasian male. [facility]. He reports, "My mother took me to the hospital yesterday. I needed detox treatment for heroin and alcohol. I have been using off & on x 10 years. I was on methadone treatment about 5 years ago for opioid addiction. It did help, but I still drank alcohol. I have not been sober from drugs and or alcohol. I use drugs & drink alcohol because they keep me going, other times, I use to cover my pain. I have not had any drug/alcohol treatment. I don't plan on going to any substance abuse treatment centers. I just want the drugs off of my systems, I know I will do better from there on. I would like to remain on Xanax because it was prescribed for me for heart palpitations. I'm not depressed", Reason for Continuation of Hospitalization: Librium taper/clonidine taper-withdrawals Mood stabilization Med management  Estimated length of stay: 3-4 days  For review of  initial/current patient goals, please see plan of care.  Attendees:  Patient:    Family:    Physician: Geoffery LyonsIrving Lugo MD 06/03/2014 10:46 AM   Nursing: Meryl DareJennifer P. RN  06/03/2014 10:46 AM   Clinical Social Worker Heather Smart, LCSWA  06/03/2014 10:46 AM   Other: Cicero DuckErika RN  06/03/2014 10:46 AM   Other:    Other: Michelle NasutiElena Pharmacist   06/03/2014 10:46 AM   Other:    Scribe for Treatment Team:  The Sherwin-WilliamsHeather Smart LCSWA 06/03/2014 10:46 AM

## 2014-06-04 NOTE — Progress Notes (Signed)
D.  Pt pleasant and appropriate, denies complaints at this time.  Positive for evening AA group, interacting appropriately within milieu.  Denies SI/HI/hallucinations at this time.  A.  Support and encouragement offered  R.  Pt remains safe on unit, will continue to monitor.

## 2014-06-04 NOTE — [facility] Group Notes (Signed)
0900 nursing orientation group    The focus of this group is to educate the patient on the purpose and policies of crisis stabilization and provide a format to answer questions about their admission.  The group details unit policies and expectations of patients while admitted.   Pt did not come he was in bed and refused to get up for group.

## 2014-06-04 NOTE — [facility] Group Notes (Signed)
[facility] Group Notes:  (Nursing/MHT/Case Management/Adjunct)  Date:  06/04/2014  Time:  5:36 PM  Type of Therapy:  Rec Therapy  Participation Level:  Did Not Attend  Summary of Progress/Problems: Unable to access gym due to floors being waxed and could not take outside due to rain.   Jacquelyne BalintForrest, Shalita Shanta 06/04/2014, 5:36 PM

## 2014-06-04 NOTE — Progress Notes (Signed)
Patient ID: Frederick NeighboursChristopher J Weingart, male   DOB: November 30, 1981, 33 y.o.   MRN: [id number] He has been in bed most of AM had to be encouraged to go to groups. Has had minimal interactions due to his being in bed. He will interact normally when he is up. Stated that he was still having aches and pain but has not requested prn medication.  His self inventory: depression and hopelessness at 5 withdrawals of craving ,tremors,chilling and agitation. Physical  Goal is to live a health er cleaner life style.

## 2014-06-04 NOTE — [facility] Group Notes (Signed)
[facility] Group Notes:  (Nursing/MHT/Case Management/Adjunct)  Date:  06/04/2014  Time:  3:38 PM  Type of Therapy:  Psychoeducational Skills  Participation Level:  Did Not Attend  Shimp, Donna Larraine 06/04/2014, 3:38 PM 

## 2014-06-04 NOTE — Progress Notes (Signed)
Patient did attend the evening speaker AA meeting.  

## 2014-06-04 NOTE — Progress Notes (Signed)
[facility]  06/04/2014 4:47 PM Frederick Young  MRN:  [id number] Subjective:  Still not doing too well. States he still feels weak. He is sleeping better. Endorses no energy, no motivation. He does not have much of an appetite but makes himself eat. He does say the medications are helping the anxiety so has seen a reduction on it Diagnosis:   DSM5: Schizophrenia Disorders:  none Obsessive-Compulsive Disorders:  none Trauma-Stressor Disorders:  none Substance/Addictive Disorders:  Alcohol Related Disorder - Severe (303.90) and Opioid Disorder - Severe (304.00)  Benzodiazepine Use Disorder Depressive Disorders:  Major Depressive Disorder - Moderate (296.22) Total Time spent with patient: 30 minutes  Axis I: Panic Disorder  ADL's:  Intact  Sleep: Fair  Appetite:  Poor  Suicidal Ideation:  Plan:  denies Intent:  denies Means:  denies Homicidal Ideation:  Plan:  denies Intent:  denies Means:  denies AEB (as evidenced by):  Psychiatric Specialty Exam: Physical Exam  Review of Systems  Constitutional: Positive for malaise/fatigue.  HENT: Negative.   Eyes: Negative.   Respiratory: Negative.   Cardiovascular: Negative.   Gastrointestinal: Negative.   Genitourinary: Negative.   Musculoskeletal: Positive for myalgias.  Skin: Negative.   Neurological: Positive for weakness.  Endo/Heme/Allergies: Negative.   Psychiatric/Behavioral: Positive for depression and substance abuse. The patient is nervous/anxious.     Blood pressure 100/65, pulse 65, temperature 97.7 F (36.5 C), temperature source Oral, resp. rate 16, height 5' 6.75" (1.695 m), weight 63.957 kg (141 lb).Body mass index is 22.26 kg/(m^2).  General Appearance: Disheveled  Eye SolicitorContact::  Fair  Speech:  Clear and Coherent and Slow  Volume:  Decreased  Mood:  Anxious and worried  Affect:  anxious, worried  Thought Process:  Coherent and Goal Directed  Orientation:  Full (Time, Place, and Person)   Thought Content:  symtpoms, worries, concerns  Suicidal Thoughts:  No  Homicidal Thoughts:  No  Memory:  Immediate;   Fair Recent;   Fair Remote;   Fair  Judgement:  Fair  Insight:  Present  Psychomotor Activity:  Normal  Concentration:  Fair  Recall:  FiservFair  Fund of Knowledge:NA  Language: Fair  Akathisia:  No  Handed:    AIMS (if indicated):     Assets:  Desire for Improvement  Sleep:  Number of Hours: 6.25   Musculoskeletal: Strength & Muscle Tone: within normal limits Gait & Station: normal Patient leans: N/A  Current Medications: Current Facility-Administered Medications  Medication Dose Route Frequency Provider Last Rate Last Dose  . acetaminophen (TYLENOL) tablet 650 mg  650 mg Oral Q6H PRN Kerry HoughSpencer E Simon, PA-C   650 mg at 06/01/14 1141  . alum & mag hydroxide-simeth (MAALOX/MYLANTA) 200-200-20 MG/5ML suspension 30 mL  30 mL Oral Q4H PRN Kerry HoughSpencer E Simon, PA-C      . dicyclomine (BENTYL) tablet 20 mg  20 mg Oral Q6H PRN Kerry HoughSpencer E Simon, PA-C   20 mg at 06/02/14 1156  . feeding supplement (ENSURE COMPLETE) (ENSURE COMPLETE) liquid 237 mL  237 mL Oral TID BM Rachael FeeIrving A Austyn Seier, MD   237 mL at 06/04/14 0840  . gabapentin (NEURONTIN) capsule 300 mg  300 mg Oral TID Rachael FeeIrving A Chane Cowden, MD   300 mg at 06/04/14 1208  . lisinopril (PRINIVIL,ZESTRIL) tablet 10 mg  10 mg Oral Daily Sanjuana KavaAgnes I Nwoko, NP   10 mg at 06/04/14 0839  . magnesium hydroxide (MILK OF MAGNESIA) suspension 30 mL  30 mL Oral Daily PRN Mena GoesSpencer E  Simon, PA-C      . methocarbamol (ROBAXIN) tablet 500 mg  500 mg Oral Q8H PRN Kerry HoughSpencer E Simon, PA-C   500 mg at 06/03/14 2148  . multivitamin with minerals tablet 1 tablet  1 tablet Oral Daily Kerry HoughSpencer E Simon, PA-C   1 tablet at 06/04/14 [id number]  . naproxen (NAPROSYN) tablet 500 mg  500 mg Oral BID PRN Kerry HoughSpencer E Simon, PA-C   500 mg at 06/03/14 2148  . nicotine (NICODERM CQ - dosed in mg/24 hours) patch 21 mg  21 mg Transdermal Q0600 Rachael FeeIrving A Reannah Totten, MD   21 mg at 06/04/14 0839  .  QUEtiapine (SEROQUEL) tablet 25 mg  25 mg Oral TID Rachael FeeIrving A Cantrell Martus, MD   25 mg at 06/04/14 1208  . thiamine (B-1) injection 100 mg  100 mg Intramuscular Once IntelSpencer E Simon, PA-C      . thiamine (VITAMIN B-1) tablet 100 mg  100 mg Oral Daily Kerry HoughSpencer E Simon, PA-C   100 mg at 06/04/14 0839  . traZODone (DESYREL) tablet 50 mg  50 mg Oral QHS,MR X 1 Kerry HoughSpencer E Simon, PA-C   50 mg at 06/03/14 2239    Lab Results: No results found for this or any previous visit (from the past 48 hour(s)).  Physical Findings: AIMS: Facial and Oral Movements Muscles of Facial Expression: None, normal Lips and Perioral Area: None, normal Jaw: None, normal Tongue: None, normal,Extremity Movements Upper (arms, wrists, hands, fingers): None, normal Lower (legs, knees, ankles, toes): None, normal, Trunk Movements Neck, shoulders, hips: None, normal, Overall Severity Severity of abnormal movements (highest score from questions above): None, normal Incapacitation due to abnormal movements: None, normal Patient's awareness of abnormal movements (rate only patient's report): No Awareness, Dental Status Current problems with teeth and/or dentures?: No Does patient usually wear dentures?: No  CIWA:  CIWA-Ar Total: 1 COWS:  COWS Total Score: 2  Treatment Plan Summary: Daily contact with patient to assess and evaluate symptoms and progress in treatment Medication management  Plan: Supportive approach/coping skills/relapse prevention           Continue to optimize response to psychotropics           CBT;mindfulness  Medical Decision Making Problem Points:  Review of psycho-social stressors (1) Data Points:  Review of medication regiment & side effects (2)  I certify that inpatient services furnished can reasonably be expected to improve the patient's condition.   [name] 06/04/2014, 4:47 PM

## 2014-06-04 NOTE — [facility] Group Notes (Signed)
[facility] Group Notes:  (Clinical Social Work)  06/04/2014     10-11AM  Summary of Progress/Problems:   The main focus of today's process group was for the patient to identify ways in which they have in the past sabotaged their own recovery. Motivational Interviewing and a worksheet were utilized to help patients explore in depth the perceived benefits and costs of their substance use, as well as the potential benefits and costs of stopping.  The Stages of Change were explained using a handout, with an emphasis on making plans to deal with sabotaging behaviors proactively.  The patient expressed that his self-sabotaging behavior is using heroin to cover both his physical pain (states he has needed 2 knee replacements for the last 8 years), as well as the emotional pain of losing his fiancee and about 20 other people in his life in death.  He talked about seeing the murder of his fiancee right in front of him.  His cousin and cousin's wife died, and now he has their children as well as his own.  He talked frankly about the possibility he could die.  The group reminded him that his children could learn how to handle their problems from watching how he handles this.  He is in the Preparation Stage of change.  Type of Therapy:  Group Therapy - Process   Participation Level:  Active  Participation Quality:  Attentive and Sharing  Affect:  Blunted and Depressed  Cognitive:  Appropriate  Insight:  Developing/Improving  Engagement in Therapy:  Developing/Improving  Modes of Intervention:  Education, Support and Processing, Motivational Interviewing  Ambrose MantleMareida Grossman-Orr, LCSW 06/04/2014, 11:17 AM

## 2014-06-04 NOTE — Progress Notes (Signed)
D. Pt has been visible in milieu today, however has had minimal interaction or participation. Pt reports difficulty with withdrawal including nausea, anxiety, agitation, chills and tremors. Pt spoke about how he is unsure of what he will be doing after he gets detoxed. Pt appears anxious and fidgety this evening and has received medications as needed. A. Support and encouragement provided. R. Safety maintained, will continue to monitor.

## 2014-06-05 MED ORDER — ONDANSETRON 4 MG PO TBDP
4.0000 mg | ORAL_TABLET | Freq: Three times a day (TID) | ORAL | Status: DC | PRN
  Administered 2014-06-05: 4 mg via ORAL
  Filled 2014-06-05: qty 1

## 2014-06-05 MED ORDER — CHLORDIAZEPOXIDE HCL 25 MG PO CAPS
ORAL_CAPSULE | ORAL | Status: AC
  Filled 2014-06-05: qty 1

## 2014-06-05 NOTE — Progress Notes (Signed)
Patient ID: Frederick NeighboursChristopher J Young, male   DOB: 1981-10-24, 33 y.o.   MRN: [id number]   D: Pt has been very flat and depressed on the unit today. Pt has attempted to attend all groups, but at times would report that he was very sleepy. Pt reported that he was ready for discharge but knows that he needs additional treatment. Pt reported in group [facility] because [facility] day program. Pt reported that his mothers [facility], but he has a court date coming up for possession and he did not want to miss court. Pt reported being negative SI/HI, no AH/VH noted. A: 15 min checks continued for patient safety. R: Pt safety maintained.

## 2014-06-05 NOTE — Progress Notes (Signed)
Patient did attend the evening speaker AA meeting.  

## 2014-06-05 NOTE — Progress Notes (Signed)
D.  Pt pleasant on approach, did complain of headache that he feels is probably related to caffeine withdrawal so he did get a [facility] and some Tylenol and feels that this will help.  Positive for evening AA group, interacting appropriately with peers on unit.  Denies SI/HI/hallucinations at this time.  A.  Support and encouragement offered, medication given as ordered.  R.  Pt remains safe on unit, will continue to monitor.

## 2014-06-05 NOTE — [facility] Group Notes (Signed)
[facility] Group Notes:  (Nursing/MHT/Case Management/Adjunct)  Date:  06/05/2014  Time:  2:14 PM  Type of Therapy:  Psychoeducational Skills  Participation Level:  Active  Participation Quality:  Appropriate  Affect:  Appropriate  Cognitive:  Appropriate  Insight:  Appropriate  Engagement in Group:  Engaged  Modes of Intervention:  Discussion  Summary of Progress/Problems: Pt did attend healthy supports systems group, pt reported that his mom and dad were his support system. Pt also reported [facility] when he is discharged.      Jacquelyne BalintForrest, Shalita Shanta 06/05/2014, 2:14 PM

## 2014-06-05 NOTE — [facility] Group Notes (Signed)
[facility] Group Notes:  (Nursing/MHT/Case Management/Adjunct)  Date:  06/05/2014  Time:  1:04 PM  Type of Therapy:  Psychoeducational Skills  Participation Level:  Did Not Attend  Buford DresserForrest, Skii Cleland Shanta 06/05/2014, 1:04 PM

## 2014-06-05 NOTE — Progress Notes (Signed)
Adult Psychoeducational Group Note  Date:  06/05/2014 Time:  3:15PM  Group Topic/Focus:  Personal Choices and Values:   The focus of this group is to help patients assess and explore the importance of values in their lives, how their values affect their decisions, how they express their values and what opposes their expression.  Participation Level:  Active  Participation Quality:  Appropriate  Affect:  Appropriate  Cognitive:  Appropriate  Insight: Appropriate  Engagement in Group:  Engaged  Modes of Intervention:  Activity and Discussion  Additional Comments:  Pt participated in the group activity and was active throughout group   LEA, JANAY K 06/05/2014, 4:02 PM

## 2014-06-05 NOTE — Progress Notes (Signed)
[facility]  06/05/2014 2:56 PM Frederick Young  MRN:  [id number] Subjective:  Frederick Young continues to have a hard time. States he had a better afternoon but this morning he was back feeling weak, with heaviness in his whole body. He states he really wants to do this. Wants to quit using. He states he will not continue to do as he has been doing. States he needs help past detox and would like to go to a rehab program. States the medications are helping some with the anxiety . States the anxiety is incapacitating C/O of nausea Diagnosis:   DSM5: Schizophrenia Disorders:  none Obsessive-Compulsive Disorders:  none Trauma-Stressor Disorders:  none Substance/Addictive Disorders:  Alcohol Related Disorder - Severe (303.90) and Opioid Disorder - Severe (304.00), Benzodiazepine Use Disorder Depressive Disorders:  Major Depressive Disorder - Moderate (296.22) Total Time spent with patient: 30 minutes  Axis I: Panic Disorder  ADL's:  Intact  Sleep: Fair  Appetite:  Poor  Suicidal Ideation:  Plan:  denies Intent:  denies Means:  denies Homicidal Ideation:  Plan:  denies Intent:  denies Means:  denies AEB (as evidenced by):  Psychiatric Specialty Exam: Physical Exam  Review of Systems  Constitutional: Positive for malaise/fatigue.  HENT: Negative.   Eyes: Negative.   Respiratory: Negative.   Cardiovascular: Negative.   Gastrointestinal: Negative.   Genitourinary: Negative.   Musculoskeletal: Negative.   Skin: Negative.   Neurological: Positive for weakness.  Endo/Heme/Allergies: Negative.   Psychiatric/Behavioral: Positive for substance abuse. The patient is nervous/anxious.     Blood pressure 131/86, pulse 50, temperature 96.8 F (36 C), temperature source Oral, resp. rate 20, height 5' 6.75" (1.695 m), weight 63.957 kg (141 lb).Body mass index is 22.26 kg/(m^2).  General Appearance: Disheveled  Eye SolicitorContact::  Fair  Speech:  Clear and Coherent and not spontaneous   Volume:  Decreased  Mood:  Anxious and Depressed  Affect:  Restricted  Thought Process:  Coherent and Goal Directed  Orientation:  Full (Time, Place, and Person)  Thought Content:  symptoms, worries, concerns  Suicidal Thoughts:  No  Homicidal Thoughts:  No  Memory:  Immediate;   Fair Recent;   Fair Remote;   Fair  Judgement:  Fair  Insight:  Present  Psychomotor Activity:  Restlessness  Concentration:  Fair  Recall:  FiservFair  Fund of Knowledge:Fair  Language: Fair  Akathisia:  No  Handed:    AIMS (if indicated):     Assets:  Desire for Improvement  Sleep:  Number of Hours: 5.25   Musculoskeletal: Strength & Muscle Tone: within normal limits Gait & Station: normal Patient leans: N/A  Current Medications: Current Facility-Administered Medications  Medication Dose Route Frequency Provider Last Rate Last Dose  . acetaminophen (TYLENOL) tablet 650 mg  650 mg Oral Q6H PRN Kerry HoughSpencer E Simon, PA-C   650 mg at 06/01/14 1141  . alum & mag hydroxide-simeth (MAALOX/MYLANTA) 200-200-20 MG/5ML suspension 30 mL  30 mL Oral Q4H PRN Kerry HoughSpencer E Simon, PA-C      . dicyclomine (BENTYL) tablet 20 mg  20 mg Oral Q6H PRN Kerry HoughSpencer E Simon, PA-C   20 mg at 06/04/14 2251  . feeding supplement (ENSURE COMPLETE) (ENSURE COMPLETE) liquid 237 mL  237 mL Oral TID BM Rachael FeeIrving A Lugo, MD   237 mL at 06/05/14 1204  . gabapentin (NEURONTIN) capsule 300 mg  300 mg Oral TID Rachael FeeIrving A Lugo, MD   300 mg at 06/05/14 1201  . lisinopril (PRINIVIL,ZESTRIL) tablet 10 mg  10 mg Oral Daily Sanjuana KavaAgnes I Nwoko, NP   10 mg at 06/05/14 [id number]  . magnesium hydroxide (MILK OF MAGNESIA) suspension 30 mL  30 mL Oral Daily PRN Kerry HoughSpencer E Simon, PA-C      . methocarbamol (ROBAXIN) tablet 500 mg  500 mg Oral Q8H PRN Kerry HoughSpencer E Simon, PA-C   500 mg at 06/04/14 2117  . multivitamin with minerals tablet 1 tablet  1 tablet Oral Daily Kerry HoughSpencer E Simon, PA-C   1 tablet at 06/05/14 [id number]  . naproxen (NAPROSYN) tablet 500 mg  500 mg Oral BID PRN Kerry HoughSpencer E  Simon, PA-C   500 mg at 06/03/14 2148  . nicotine (NICODERM CQ - dosed in mg/24 hours) patch 21 mg  21 mg Transdermal Q0600 Rachael FeeIrving A Lugo, MD   21 mg at 06/05/14 0602  . ondansetron (ZOFRAN-ODT) disintegrating tablet 4 mg  4 mg Oral Q8H PRN Rachael FeeIrving A Lugo, MD   4 mg at 06/05/14 1204  . QUEtiapine (SEROQUEL) tablet 25 mg  25 mg Oral TID Rachael FeeIrving A Lugo, MD   25 mg at 06/05/14 1201  . thiamine (B-1) injection 100 mg  100 mg Intramuscular Once IntelSpencer E Simon, PA-C      . thiamine (VITAMIN B-1) tablet 100 mg  100 mg Oral Daily Kerry HoughSpencer E Simon, PA-C   100 mg at 06/05/14 [id number]  . traZODone (DESYREL) tablet 50 mg  50 mg Oral QHS,MR X 1 Spencer E Simon, PA-C   50 mg at 06/04/14 2251    Lab Results: No results found for this or any previous visit (from the past 48 hour(s)).  Physical Findings: AIMS: Facial and Oral Movements Muscles of Facial Expression: None, normal Lips and Perioral Area: None, normal Jaw: None, normal Tongue: None, normal,Extremity Movements Upper (arms, wrists, hands, fingers): None, normal Lower (legs, knees, ankles, toes): None, normal, Trunk Movements Neck, shoulders, hips: None, normal, Overall Severity Severity of abnormal movements (highest score from questions above): None, normal Incapacitation due to abnormal movements: None, normal Patient's awareness of abnormal movements (rate only patient's report): No Awareness, Dental Status Current problems with teeth and/or dentures?: No Does patient usually wear dentures?: No  CIWA:  CIWA-Ar Total: 0 COWS:  COWS Total Score: 2  Treatment Plan Summary: Daily contact with patient to assess and evaluate symptoms and progress in treatment Medication management  Plan: Supportive approach/coping skills/relapse prevention           CBT/mindfulness           Resume the Zofran for the nausea           Complete the detox/optimize response to the psychotropics           Explore rehab options             Medical Decision  Making Problem Points:  Review of psycho-social stressors (1) Data Points:  Review of medication regiment & side effects (2) Review of new medications or change in dosage (2)  I certify that inpatient services furnished can reasonably be expected to improve the patient's condition.   LUGO,IRVING A 06/05/2014, 2:56 PM

## 2014-06-05 NOTE — [facility] Group Notes (Signed)
[facility] Group Notes: (Clinical Social Work)   06/05/2014      Type of Therapy:  Group Therapy   Participation Level:  Did Not Attend    Frederick MantleMareida Grossman-Orr, LCSW 06/05/2014, 12:35 PM

## 2014-06-06 DIAGNOSIS — F411 Generalized anxiety disorder: Secondary | ICD-10-CM

## 2014-06-06 NOTE — Progress Notes (Signed)
Patient ID: Frederick NeighboursChristopher J Wooding, male   DOB: 25-Dec-1980, 33 y.o.   MRN: [id number] He has been up and in day room sleeping at times. He has requested prn for a headache. No other distress.

## 2014-06-06 NOTE — [facility] Group Notes (Signed)
[facility] L[facility] Discharge Planning Group Note   06/06/2014 10:08 AM  Participation Quality:  Appropriate   Mood/Affect:  Appropriate  Depression Rating:  3  Anxiety Rating:  7  Thoughts of Suicide:  No Will you contract for safety?   NA  Current AVH:  No  Plan for Discharge/Comments:  Pt reports that [facility] and [facility]. CSW assessing-sent referral this morning. Pt reporting mild withdrawals"I still feel really weak." High anxiety today.   Transportation Means: mom   Supports: mother/some family supports identified.   Smart, American FinancialHeather LCSWA

## 2014-06-06 NOTE — Progress Notes (Addendum)
Patient ID: Darvin NeighboursChristopher J Kincaid, male   DOB: 16-Oct-1981, 33 y.o.   MRN: [id number] He has been up and about and to groups, Has been out of bed today interacting with peers and staff. Stated that he wanted to leave today. Self inventory: Depressed and hopeless at 4, withdrawal symptoms tremors of his hands and no SI thoughts. Pain of 3,was given a prn for a headache. Stated that he feels much better. Plan on discharge is to, Stay clean and live better.

## 2014-06-06 NOTE — Progress Notes (Signed)
[facility]  06/06/2014 3:37 PM Frederick Young Subjective:  States that some of the symptoms are better today. States he is not feeling as weak as he was before. He has seen some improvement in his anxiety. He is committed to abstinence. States he is going to need more help. States he knows he is not going to be able to make it otherwise. He was able to see his daughter. He states he wants to do this for him and his kids. 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  HENT: Negative.   Eyes: Negative.   Respiratory: Negative.   Cardiovascular: Negative.   Gastrointestinal: Positive for nausea.  Genitourinary: Negative.   Musculoskeletal: Positive for myalgias.  Skin: Negative.   Neurological: Positive for weakness.  Endo/Heme/Allergies: Negative.   Psychiatric/Behavioral: Positive for substance abuse. The patient is nervous/anxious.     Blood pressure 159/99, pulse 66, temperature 96.5 F (35.8 C), temperature source Oral, resp. rate 16, height 5' 6.75" (1.695 m), weight 63.957 kg (141 lb).Body mass index is 22.26 kg/(m^2).  General Appearance: Fairly Groomed  Patent attorneyye Contact::  Fair  Speech:  Clear and Coherent  Volume:   Decreased  Mood:  Anxious and sad, worried  Affect:  Restricted  Thought Process:  Coherent and Goal Directed  Orientation:  Full (Time, Place, and Person)  Thought Content:  symtpoms, worries, concerns plans  Suicidal Thoughts:  No  Homicidal Thoughts:  No  Memory:  Immediate;   Fair Recent;   Fair Remote;   Fair  Judgement:  Fair  Insight:  Present  Psychomotor Activity:  Restlessness  Concentration:  Fair  Recall:  FiservFair  Fund of Knowledge:NA  Language: Fair  Akathisia:  No  Handed:    AIMS (if indicated):     Assets:  Desire for Improvement Social Support  Sleep:  Number of Hours: 6.25   Musculoskeletal: Strength & Muscle Tone: within normal limits Gait & Station: normal Patient leans: N/A  Current Medications: Current Facility-Administered Medications  Medication Dose Route Frequency [name] Last Rate Last Dose  . acetaminophen (TYLENOL) tablet 650 mg  650 mg Oral Q6H PRN Kerry HoughSpencer E Simon, PA-C   650 mg at 06/06/14 [id number]  . alum & mag hydroxide-simeth (MAALOX/MYLANTA) 200-200-20 MG/5ML suspension 30 mL  30 mL Oral Q4H PRN Kerry HoughSpencer E Simon, PA-C      . feeding supplement (ENSURE COMPLETE) (ENSURE COMPLETE) liquid 237 mL  237 mL Oral TID BM Rachael FeeIrving A Lugo, MD   237 mL at 06/05/14 1204  . gabapentin (NEURONTIN) capsule 300 mg  300 mg Oral TID Rachael FeeIrving A Lugo, MD   300 mg at 06/06/14 1145  . lisinopril (PRINIVIL,ZESTRIL) tablet 10 mg  10 mg Oral Daily Sanjuana KavaAgnes I Nwoko, NP   10 mg at 06/06/14 0758  . magnesium hydroxide (MILK OF  MAGNESIA) suspension 30 mL  30 mL Oral Daily PRN Kerry HoughSpencer E Simon, PA-C      . multivitamin with minerals tablet 1 tablet  1 tablet Oral Daily Kerry HoughSpencer E Simon, PA-C   1 tablet at 06/06/14 0758  . nicotine (NICODERM CQ - dosed in mg/24 hours) patch 21 mg  21 mg Transdermal Q0600 Rachael FeeIrving A Lugo, MD   21 mg at 06/06/14 0602  . ondansetron (ZOFRAN-ODT) disintegrating tablet 4 mg  4 mg Oral Q8H PRN Rachael FeeIrving A Lugo, MD   4 mg at 06/05/14 1204  . QUEtiapine (SEROQUEL)  tablet 25 mg  25 mg Oral TID Rachael FeeIrving A Lugo, MD   25 mg at 06/06/14 1145  . thiamine (B-1) injection 100 mg  100 mg Intramuscular Once IntelSpencer E Simon, PA-C      . thiamine (VITAMIN B-1) tablet 100 mg  100 mg Oral Daily Kerry HoughSpencer E Simon, PA-C   100 mg at 06/06/14 0758  . traZODone (DESYREL) tablet 50 mg  50 mg Oral QHS,MR X 1 Kerry HoughSpencer E Simon, PA-C   50 mg at 06/05/14 2213    Lab Results: No results found for this or any previous visit (from the past 48 hour(s)).  Physical Findings: AIMS: Facial and Oral Movements Muscles of Facial Expression: None, normal Lips and Perioral Area: None, normal Jaw: None, normal Tongue: None, normal,Extremity Movements Upper (arms, wrists, hands, fingers): None, normal Lower (legs, knees, ankles, toes): None, normal, Trunk Movements Neck, shoulders, hips: None, normal, Overall Severity Severity of abnormal movements (highest score from questions above): None, normal Incapacitation due to abnormal movements: None, normal Patient's awareness of abnormal movements (rate only patient's report): No Awareness, Dental Status Current problems with teeth and/or dentures?: No Does patient usually wear dentures?: No  CIWA:  CIWA-Ar Total: 0 COWS:  COWS Total Score: 2  Treatment Plan Summary: Daily contact with patient to assess and evaluate symptoms and progress in treatment Medication management  Plan: Supportive approach/coping skills/relapse prevention           Optimize treatment with psychotropics  Medical Decision Making Problem Points:  Review of psycho-social stressors (1) Data Points:  Review of medication regiment & side effects (2) Review of new medications or change in dosage (2)  I certify that inpatient services furnished can reasonably be expected to improve the patient's condition.   LUGO,IRVING A 06/06/2014, 3:37 PM

## 2014-06-06 NOTE — [facility] Group Notes (Signed)
[facility] LCSW Group Therapy  06/06/2014 3:13 PM  Type of Therapy:  Group Therapy  Participation Level:  Active  Participation Quality:  Attentive  Affect:  Appropriate  Cognitive:  Alert and Oriented  Insight:  Engaged  Engagement in Therapy:  Engaged  Modes of Intervention:  Confrontation, Discussion, Education, Exploration, Problem-solving, Rapport Building, Socialization and Support  Summary of Progress/Problems: Today's Topic: Overcoming Obstacles. Pt identified obstacles faced currently and processed barriers involved in overcoming these obstacles. Pt identified steps necessary for overcoming these obstacles and explored motivation (internal and external) for facing these difficulties head on. Pt further identified one area of concern in their lives and chose a skill of focus pulled from their "toolbox." [name] was attentive and engaged throughout today's therapy group. He shared that his biggest obstacle involves "being away from my 32 year old daughter while I'm in treatment." [name] shows progress in the group setting and improving insight AEB his ability to process how going to treatment will help his relationship with his daughter in the long term. "she is smart and knows what is going on. I will miss her but I know what I am doing is for both of us." [name] shared that he plans to go to NA groups/AA groups after treatment to continue his recovery.    Smart, Heather LCSWA 06/06/2014, 3:13 PM

## 2014-06-06 NOTE — Progress Notes (Signed)
Adult Psychoeducational Group Note  Date:  06/06/2014 Time:  10:00 am  Group Topic/Focus:  Wellness Toolbox:   The focus of this group is to discuss various aspects of wellness, balancing those aspects and exploring ways to increase the ability to experience wellness.  Patients will create a wellness toolbox for use upon discharge.  Participation Level:  Active  Participation Quality:  Appropriate, Sharing and Supportive  Affect:  Appropriate  Cognitive:  Appropriate  Insight: Appropriate  Engagement in Group:  Engaged  Modes of Intervention:  Discussion, Education, Socialization and Support  Additional Comments:  Pt stated that he can attend rehab and church [facility] to assist with living more healthy. Pt stated that the barriers to his progress are communicating with drug dealers and grief from the loss of family members.   Laural BenesJohnson, [place] 06/06/2014, 10:47 AM

## 2014-06-07 MED ORDER — QUETIAPINE FUMARATE 25 MG PO TABS: 25.0000 mg | ORAL_TABLET | Freq: Three times a day (TID) | ORAL | Status: DC

## 2014-06-07 MED ORDER — GABAPENTIN 300 MG PO CAPS: 300.0000 mg | ORAL_CAPSULE | Freq: Three times a day (TID) | ORAL | Status: DC

## 2014-06-07 MED ORDER — TRAZODONE HCL 50 MG PO TABS: 50.0000 mg | ORAL_TABLET | Freq: Every evening | ORAL | Status: DC | PRN

## 2014-06-07 MED ORDER — LISINOPRIL 10 MG PO TABS
10.0000 mg | ORAL_TABLET | Freq: Every day | ORAL | Status: DC
Start: 2014-06-07 — End: 2014-06-17

## 2014-06-07 MED ORDER — IBUPROFEN 800 MG PO TABS
800.0000 mg | ORAL_TABLET | Freq: Four times a day (QID) | ORAL | Status: DC | PRN
  Administered 2014-06-07: 800 mg via ORAL
  Filled 2014-06-07: qty 1

## 2014-06-07 NOTE — Progress Notes (Signed)
Pt reports he is doing better, although he says he is still having tremors.  He denies SI/HI/AV.  He [facility]  He is determined to stay sober so he can be a better father to his kids.  He is c/o a persistent headache and receiving Tylenol as ordered.  Pt is cooperative/pleasant.  Support and encouragement offered.  Pt makes his needs known to staff.  Safety maintained with q15 minute checks.

## 2014-06-07 NOTE — Progress Notes (Signed)
Recreation Therapy Notes  Animal-Assisted Activity/Therapy (AAA/T) Program Checklist/Progress Notes Patient Eligibility Criteria Checklist & Daily Group note for Rec Tx Intervention  Date: 06.30.2015 Time: 2:45 Location: [address] Dayroom    AAA/T Program Assumption of Risk Form signed by Patient/ or Parent Legal Guardian yes  Patient is free of allergies or sever asthma yes  Patient reports no fear of animals yes  Patient reports no history of cruelty to animals yes   Patient understands his/her participation is voluntary yes  Patient washes hands before animal contact yes  Patient washes hands after animal contact yes  Behavioral Response: Appropriate   Education: Hand Washing, Appropriate Animal Interaction   Education Outcome: Acknowledges understanding   Clinical Observations/Feedback: Patient interacted appropriately with therapeutic dog team and peers during session.   Nash Bolls L Marlisha Vanwyk, LRT/CTRS  Kathreen Dileo L 06/07/2014 4:31 PM 

## 2014-06-07 NOTE — Progress Notes (Signed)
Child/Adolescent Psychoeducational Group Note  Date:  06/07/2014 Time:  9:59 PM  Group Topic/Focus:  Wrap-Up Group:   The focus of this group is to help patients review their daily goal of treatment and discuss progress on daily workbooks.  Participation Level:  Did Not Attend  Participation Quality:  Did Not Attend   Affect:  Did Not Attend  Cognitive:  Did Not Attend  Insight:  Did Not Attend  Engagement in Group:  Did Not Attend  Modes of Intervention:  Did Not Attend  Additional Comments:  Pt did not attend group due to taking a shower.  Sheran Lawlesseese, Jonathon O 06/07/2014, 9:59 PM

## 2014-06-07 NOTE — [facility] Group Notes (Signed)
[facility] LCSW Group Therapy  06/07/2014 2:49 PM  Type of Therapy:  Group Therapy  Participation Level:  Active  Participation Quality:  Attentive  Affect:  Appropriate  Cognitive:  Alert and Oriented  Insight:  Engaged  Engagement in Therapy:  Engaged  Modes of Intervention:  Confrontation, Discussion, Education, Exploration, Problem-solving, Rapport Building, Socialization and Support  Summary of Progress/Problems: MHA Speaker came to talk about his personal journey with substance abuse and addiction. [place] processed ways by which to relate to the speaker. MHA speaker provided handouts and educational information pertaining to groups and services offered by the [facility].    Smart, Heather LCSWA  06/07/2014, 2:49 PM

## 2014-06-07 NOTE — Progress Notes (Signed)
Patient ID: Frederick NeighboursChristopher J Duell, male   DOB: December 03, 1981, 33 y.o.   MRN: [id number] He has been up more today interacting and laughing with peers. He has refused his ensure today.  Self inventory: depressed and hopeless at 3 down from 4 yesterday. Denies SI thoughts, withdrawals of tremors, no tremors seen.

## 2014-06-07 NOTE — Progress Notes (Signed)
[facility] :  Will you be returning to the same living situation after discharge: No. [facility] 8[facility] At discharge, do you have transportation home?:Yes,  pt's mother picking him up at 7:15AM [facility] Do you have the ability to pay for your medications:Yes,  mental health  Release of information consent forms completed and submitted to Medical Records by CSW. Patient to Follow up at: Follow-up Information   [facility]. (Arrive at 8am for admission. Make sure to bring ID, medication supply, and clothing. Contact person: JEFF A.)    Contact information:   [address] [place], [place] [address] Phone: [phone] Fax: [phone]      [facility] with [facility]. (Walk-in between 8am-9am Monday through Friday for hospital follow-up/medication management/assessment for therapy services. )    Contact information:   [address] [place], [place] [address] Phone: [phone] Fax: [phone]      Patient denies SI/HI:   Yes,  during admission, group, and self report.     Safety Planning and Suicide Prevention discussed:  Yes,  SPE not required for this pt as he did not endorse SI during admission or [facility]. SPI pamphlet provided to pt and he was encouraged to share information with support network, ask questions, and talk about any concerns relating to SPE.  Smart, Eleanor Dimichele LCSWA  06/07/2014, 10:41 AM

## 2014-06-07 NOTE — Tx Team (Signed)
Interdisciplinary Treatment Plan Update (Adult)  Date: 06/07/2014   Time Reviewed: 10:38 AM  Progress in Treatment:  Attending groups: Yes  Participating in groups:  Minimally  Taking medication as prescribed: Yes  Tolerating medication: Yes  Family/Significant othe contact made: Not needed. SPE not required for this pt.   Patient understands diagnosis: Yes, AEB seeking treatment for ETOH detox/heroin detox, mood stabilization, and med management.  Discussing patient identified problems/goals with staff: Yes  Medical problems stabilized or resolved: Yes  Denies suicidal/homicidal ideation: Yes during admission, group, and self report.  Patient has not harmed self or Others: Yes  New problem(s) identified:  Discharge Plan or Barriers: Pt [facility] Wed 7/1 at 8am. Pt's mother to transport him to facility in the AM (7:15AM d/c). [facility] to [facility] med [facility].  Additional comments: n/a  Estimated length of stay: 1 day (d/c scheduled for wed at 7:15AM-[facility] admission scheduled for 8am)  For review of initial/current patient goals, please see plan of care.  Attendees:  Patient:    Family:    Physician: Geoffery LyonsIrving Lugo MD 06/07/2014 10:38 AM   Nursing: Vanessa KickJan RN  06/07/2014 10:38 AM   Clinical Social Worker Heather Smart, LCSWA  06/07/2014 10:38 AM   Other: Okey Regalarol RN  06/07/2014 10:38 AM   Other:    Other: Lupita Leashonna RN 06/07/2014 10:38 AM   Other:    Scribe for Treatment Team:  The Sherwin-WilliamsHeather Smart LCSWA 06/07/2014 10:38 AM

## 2014-06-07 NOTE — Progress Notes (Signed)
[facility] MD Progress Note  06/07/2014 6:54 PM Frederick NeighboursChristopher J Young  MRN:  [id number] Subjective:  Frederick OhmChris continues to state he is committed to abstinence. [facility]. He still has some vague physical complains. Sleep is still challenging but states he knows this is going to be temporary. The anxiety is slowly getting better. 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   Eyes: Negative.   Respiratory: Negative.   Cardiovascular: Negative.   Gastrointestinal: Negative.   Genitourinary: Negative.   Musculoskeletal: Negative.   Skin: Negative.   Neurological: Positive for headaches.  Endo/Heme/Allergies: Negative.   Psychiatric/Behavioral: Positive for substance abuse. The patient is nervous/anxious.     Blood pressure 102/69, pulse 87, temperature 96.5 F (35.8 C), temperature source Oral, resp. rate 16, height 5' 6.75" (1.695 m), weight 63.957 kg (141 lb).Body mass index is 22.26 kg/(m^2).  General Appearance: Fairly Groomed  Patent attorneyye Contact::  Fair  Speech:  Clear and Coherent  Volume:  Decreased  Mood:  Anxious and worried  Affect:  Appropriate  Thought Process:  Coherent and Goal Directed  Orientation:  Full (Time, Place, and Person)  Thought Content:  symptoms,  worries, concerns  Suicidal Thoughts:  No  Homicidal Thoughts:  No  Memory:  Immediate;   Fair Recent;   Fair Remote;   Fair  Judgement:  Fair  Insight:  Present  Psychomotor Activity:  Restlessness  Concentration:  Fair  Recall:  FiservFair  Fund of Knowledge:NA  Language: Fair  Akathisia:  No  Handed:    AIMS (if indicated):     Assets:  Desire for Improvement  Sleep:  Number of Hours: 6.75   Musculoskeletal: Strength & Muscle Tone: within normal limits Gait & Station: normal Patient leans: N/A  Current Medications: Current Facility-Administered Medications  Medication Dose Route Frequency Hayward Rylander Last Rate Last Dose  . acetaminophen (TYLENOL) tablet 650 mg  650 mg Oral Q6H PRN Kerry HoughSpencer E Simon, PA-C   650 mg at 06/06/14 2129  . alum & mag hydroxide-simeth (MAALOX/MYLANTA) 200-200-20 MG/5ML suspension 30 mL  30 mL Oral Q4H PRN Kerry HoughSpencer E Simon, PA-C      . feeding supplement (ENSURE COMPLETE) (ENSURE COMPLETE) liquid 237 mL  237 mL Oral TID BM Rachael FeeIrving A Lugo, MD   237 mL at 06/05/14 1204  . gabapentin (NEURONTIN) capsule 300 mg  300 mg Oral TID Rachael FeeIrving A Lugo, MD   300 mg at 06/07/14 1620  . ibuprofen (ADVIL,MOTRIN) tablet 800 mg  800 mg Oral Q6H PRN Rachael FeeIrving A Lugo, MD   800 mg at 06/07/14 1256  . lisinopril (PRINIVIL,ZESTRIL) tablet 10 mg  10 mg Oral Daily Sanjuana KavaAgnes I Nwoko, NP   10 mg at 06/07/14 0831  . magnesium hydroxide (MILK OF MAGNESIA) suspension 30 mL  30 mL Oral Daily PRN Kerry HoughSpencer E Simon, PA-C      .  multivitamin with minerals tablet 1 tablet  1 tablet Oral Daily Kerry HoughSpencer E Simon, PA-C   1 tablet at 06/07/14 16100832  . nicotine (NICODERM CQ - dosed in mg/24 hours) patch 21 mg  21 mg Transdermal Q0600 Rachael FeeIrving A Lugo, MD   21 mg at 06/07/14 0617  . ondansetron (ZOFRAN-ODT) disintegrating tablet 4 mg  4 mg Oral Q8H PRN Rachael FeeIrving A Lugo, MD   4 mg at 06/05/14 1204  . QUEtiapine (SEROQUEL) tablet 25 mg  25 mg Oral TID Rachael FeeIrving A Lugo, MD   25 mg at 06/07/14 1620  . thiamine (B-1) injection 100  mg  100 mg Intramuscular Once IntelSpencer E Simon, PA-C      . thiamine (VITAMIN B-1) tablet 100 mg  100 mg Oral Daily Kerry HoughSpencer E Simon, PA-C   100 mg at 06/07/14 0831  . traZODone (DESYREL) tablet 50 mg  50 mg Oral QHS,MR X 1 Kerry HoughSpencer E Simon, PA-C   50 mg at 06/06/14 2212    Lab Results: No results found for this or any previous visit (from the past 48 hour(s)).  Physical Findings: AIMS: Facial and Oral Movements Muscles of Facial Expression: None, normal Lips and Perioral Area: None, normal Jaw: None, normal Tongue: None, normal,Extremity Movements Upper (arms, wrists, hands, fingers): None, normal Lower (legs, knees, ankles, toes): None, normal, Trunk Movements Neck, shoulders, hips: None, normal, Overall Severity Severity of abnormal movements (highest score from questions above): None, normal Incapacitation due to abnormal movements: None, normal Patient's awareness of abnormal movements (rate only patient's report): No Awareness, Dental Status Current problems with teeth and/or dentures?: No Does patient usually wear dentures?: No  CIWA:  CIWA-Ar Total: 0 COWS:  COWS Total Score: 2  Treatment Plan Summary: Daily contact with patient to assess and evaluate symptoms and progress in treatment Medication management  Plan: Supportive approach/coping skills/relapse prevention           Optimize response to the psychotropics            Facilitate [facility] the [facility]            Address the headache Medical Decision Making Problem Points:  Review of psycho-social stressors (1) Data Points:  Review of medication regiment & side effects (2)  I certify that inpatient services furnished can reasonably be expected to improve the patient's condition.   LUGO,IRVING A 06/07/2014, 6:54 PM

## 2014-06-07 NOTE — [facility] Suicide Risk Assessment (Signed)
Suicide Risk Assessment  Discharge Assessment     Demographic Factors:  Male and Caucasian  Total Time spent with patient: 45 minutes  Psychiatric Specialty Exam:     Blood pressure 102/69, pulse 87, temperature 96.5 F (35.8 C), temperature source Oral, resp. rate 16, height 5' 6.75" (1.695 m), weight 63.957 kg (141 lb).Body mass index is 22.26 kg/(m^2).  General Appearance: Fairly Groomed  Patent attorneyye Contact::  Fair  Speech:  Clear and Coherent  Volume:  Normal  Mood:  Anxious  Affect:  Appropriate  Thought Process:  Coherent and Goal Directed  Orientation:  Full (Time, Place, and Person)  Thought Content:  plans as he moves on, relapse prevention plan  Suicidal Thoughts:  No  Homicidal Thoughts:  No  Memory:  Immediate;   Fair Recent;   Fair Remote;   Fair  Judgement:  Fair  Insight:  Present  Psychomotor Activity:  Normal  Concentration:  Fair  Recall:  FiservFair  Fund of Knowledge:NA  Language: Fair  Akathisia:  No  Handed:    AIMS (if indicated):     Assets:  Desire for Improvement Social Support Vocational/Educational  Sleep:  Number of Hours: 6.75    Musculoskeletal: Strength & Muscle Tone: within normal limits Gait & Station: normal Patient leans: N/A   Mental Status Per Nursing Assessment::   On Admission:  NA  Current Mental Status by Physician: In full contact with reality. There are no overt S/S of withdraws. He is willing and motivated to pursue a residential treatment center   Loss Factors: Legal issues  Historical Factors: NA  Risk Reduction Factors:   Sense of responsibility to family and Positive social support  Continued Clinical Symptoms:  Alcohol/Substance Abuse/Dependencies  Cognitive Features That Contribute To Risk: none identifed   Suicide Risk:  Minimal: No identifiable suicidal ideation.  Patients presenting with no risk factors but with morbid ruminations; may be classified as minimal risk based on the severity of the depressive  symptoms  Discharge Diagnoses:   AXIS I:  Alcohol, Opioid Benzodiazepine Dependence, Substance Induced Mood Disorder, Panic Disorder AXIS II:  No diagnosis AXIS III:   Past Medical History  Diagnosis Date  . Anxiety   . Substance abuse   . Heart palpitations   . Hypercholesteremia    AXIS IV:  other psychosocial or environmental problems AXIS V:  61-70 mild symptoms  Plan Of Care/Follow-up recommendations:  Activity:  as tolerated Diet:  regular Follow up [facility]  Is patient on multiple antipsychotic therapies at discharge:  No   Has Patient had three or more failed trials of antipsychotic monotherapy by history:  No  Recommended Plan for Multiple Antipsychotic Therapies: NA    LUGO,IRVING A 06/07/2014, 7:03 PM

## 2014-06-07 NOTE — Discharge Summary (Signed)
Physician Discharge Summary Note  Patient:  Frederick Young is an 33 y.o., male MRN:  [id number] DOB:  28-May-1981 Patient phone:  [phone] (home)  Patient address:   [address] [place] [address],  Total Time spent with patient: Greater than 30 minutes  Date of Admission:  06/01/2014 Date of Discharge: 06/08/14  Reason for Admission: Alcohol/drug detox  Discharge Diagnoses: Active Problems:   Substance induced mood disorder   Opioid dependence   Alcohol dependence   Benzodiazepine dependence   Psychiatric Specialty Exam: Physical Exam  Psychiatric: His speech is normal and behavior is normal. Judgment and thought content normal. His mood appears not anxious. His affect is not angry, not blunt, not labile and not inappropriate. Cognition and memory are normal. He does not exhibit a depressed mood.    Review of Systems  Constitutional: Negative.   HENT: Negative.   Eyes: Negative.   Respiratory: Negative.   Cardiovascular: Negative.   Gastrointestinal: Negative.   Genitourinary: Negative.   Musculoskeletal: Negative.   Skin: Negative.   Neurological: Negative.   Endo/Heme/Allergies: Negative.   Psychiatric/Behavioral: Positive for depression (Stable) and substance abuse (Opioid, alcohol & Benzodiazepine dependence). Negative for suicidal ideas, hallucinations and memory loss. The patient has insomnia (Stable). The patient is not nervous/anxious.     Blood pressure 122/79, pulse 91, temperature 97.3 F (36.3 C), temperature source Oral, resp. rate 18, height 5' 6.75" (1.695 m), weight 63.957 kg (141 lb).Body mass index is 22.26 kg/(m^2).   General Appearance: Fairly Groomed   Patent attorney:: Fair   Speech: Clear and Coherent   Volume: Normal   Mood: Anxious   Affect: Appropriate   Thought Process: Coherent and Goal Directed   Orientation: Full (Time, Place, and Person)   Thought Content: plans as he moves on, relapse prevention plan   Suicidal  Thoughts: No   Homicidal Thoughts: No   Memory: Immediate; Fair  Recent; Fair  Remote; Fair   Judgement: Fair   Insight: Present   Psychomotor Activity: Normal   Concentration: Fair   Recall: [employer] of Knowledge:NA   Language: Fair   Akathisia: No   Handed:   AIMS (if indicated):   Assets: Desire for Improvement  Social Support  Vocational/Educational   Sleep: Number of Hours: 6.75    Past Psychiatric History: Diagnosis: Alcohol dependence, Opioid dependence, Substance induced mood disorder, Benzodiazepine pendence  Hospitalizations: [facility] adult unit  Outpatient Care: [facility]  Substance Abuse Care: [facility]  Self-Mutilation: NA  Suicidal Attempts: NA  Violent Behaviors: NA   Musculoskeletal: Strength & Muscle Tone: within normal limits Gait & Station: normal Patient leans: N/A  DSM5: Schizophrenia Disorders:  NA Obsessive-Compulsive Disorders:  NA Trauma-Stressor Disorders:  NA Substance/Addictive Disorders:  Polysubstance dependence Depressive Disorders:  Substance induced mood disorder  Axis Diagnosis:  AXIS I:  Alcohol dependence, Opioid dependence, Substance induced mood disorder, Benzodiazepine pendence AXIS II:  Deferred AXIS III:   Past Medical History  Diagnosis Date  . Anxiety   . Substance abuse   . Heart palpitations   . Hypercholesteremia    AXIS IV:  other psychosocial or environmental problems and Polysubstance dependence AXIS V:  62  Level of Care:  OP  Hospital Course:  Frederick Young is 33 years old, Caucasian male. Admitted from the [facility]. He reports, "My mother took me to the hospital yesterday. I needed detox treatment for heroin and alcohol. I have been using off & on x 10 years. I was on  methadone treatment about 5 years ago for opioid addiction. It did help, but I still drank alcohol. I have not been sober from drugs and or alcohol. I use drugs & drink alcohol because they keep me going, other times, I use to  cover my pain. I have not had any drug/alcohol treatment. I don't plan on going to any substance abuse treatment centers. I just want the drugs off of my systems, I know I will do better from there on. I would like to remain on Xanax because it was prescribed for me for heart palpitations. I'm not depressed"  Frederick Young was admitted to the hospital with his UDS reports positive for multiple substances that included; Amphetamine, Opioid, Benzodiazepine and THC. He was going through substance withdrawal symptoms, requiring detoxification treatment. To re-staibilize his systems of drug intoxications and combat the withdrawal symptoms, Frederick Young received both Librium and clonidine detox protocols. He was also enrolled in the group counseling sessions and AA/NA meetings being offered and held on this unit. He learned coping skills.   Besides the detoxification treatments, Frederick Young was medicated with Neurontin 300 mg three times daily for substance withdrawal symptoms, Seroquel 25 mg three times daily for agitation/mood control and Trazodone 50 mg Q bedtime for sleep. He also received other medication management for his other pre-existing medical conditions. He tolerated his treatment regimen without any significant adverse effects and or reactions.  Frederick Young has completed detox treatment and his mood is stable. This is evidenced by his reports of improved mood and absence of withdrawal symptoms. He is currently being discharged to continue substance abuse treatment at the [facility] [place], [place]. And for medication management and routine psychiatric care, he will be receiving these services at the [facility] [place], [place].  Upon discharge, he adamantly denies any SIHI, AVH, delusional thoughts, paranoia and or withdrawal symptoms. [facility] with all personal belongings in no apparent distress. He received 14 days worth supply samples of his [facility] discharge  medications. Transportation per mother.   Consults:  psychiatry  Significant Diagnostic Studies:  labs: CBC with diff, CMP, UDS, toxicology tests, U/A  Discharge Vitals:   Blood pressure 122/79, pulse 91, temperature 97.3 F (36.3 C), temperature source Oral, resp. rate 18, height 5' 6.75" (1.695 m), weight 63.957 kg (141 lb). Body mass index is 22.26 kg/(m^2). Lab Results:   No results found for this or any previous visit (from the past 72 hour(s)).  Physical Findings: AIMS: Facial and Oral Movements Muscles of Facial Expression: None, normal Lips and Perioral Area: None, normal Jaw: None, normal Tongue: None, normal,Extremity Movements Upper (arms, wrists, hands, fingers): None, normal Lower (legs, knees, ankles, toes): None, normal, Trunk Movements Neck, shoulders, hips: None, normal, Overall Severity Severity of abnormal movements (highest score from questions above): None, normal Incapacitation due to abnormal movements: None, normal Patient's awareness of abnormal movements (rate only patient's report): No Awareness, Dental Status Current problems with teeth and/or dentures?: No Does patient usually wear dentures?: No  CIWA:  CIWA-Ar Total: 0 COWS:  COWS Total Score: 2  Psychiatric Specialty Exam: See Psychiatric Specialty Exam and Suicide Risk Assessment completed by Attending Physician prior to discharge.  Discharge destination:  [facility]  Is patient on multiple antipsychotic therapies at discharge:  No   Has Patient had three or more failed trials of antipsychotic monotherapy by history:  No  Recommended Plan for Multiple Antipsychotic Therapies: NA    Medication List    STOP taking  these medications       ALPRAZolam 1 MG tablet  Commonly known as:  XANAX     clonazePAM 2 MG tablet  Commonly known as:  KLONOPIN     EPINEPHrine 0.3 mg/0.3 mL Devi  Commonly known as:  EPI-PEN      TAKE these medications     Indication   gabapentin 300 MG  capsule  Commonly known as:  NEURONTIN  Take 1 capsule (300 mg total) by mouth 3 (three) times daily. For Substance withdrawal syndrome   Indication:  Substance withdrawal syndrome     lisinopril 10 MG tablet  Commonly known as:  PRINIVIL,ZESTRIL  Take 1 tablet (10 mg total) by mouth daily. For high blood pressure   Indication:  High Blood Pressure     QUEtiapine 25 MG tablet  Commonly known as:  SEROQUEL  Take 1 tablet (25 mg total) by mouth 3 (three) times daily. For mood control/agitation   Indication:  Mood control/agitaion     traZODone 50 MG tablet  Commonly known as:  DESYREL  Take 1 tablet (50 mg total) by mouth at bedtime and may repeat dose one time if needed. For sleep   Indication:  Trouble Sleeping       Follow-up Information   [facility] with [facility] On 06/08/2014. (Arrive at 8am for admission. Make sure to bring ID, medication supply, and clothing. Contact person: JEFF A.)    Contact information:   [address] [place], [place] [address] Phone: [phone] Fax: [phone]      Follow up with [facility]. (Walk-in between 8am-9am Monday through Friday for hospital follow-up/medication management/assessment for therapy services. )    Contact information:   [address] [place], [place] [address] Phone: [phone] Fax: [phone]     Follow-up recommendations:  Activity:  As tolerated Diet: As recommended by your primary care doctor. Keep all scheduled follow-up appointments as recommended.  Comments: Take all your medications as prescribed by your mental healthcare provider. Report any adverse effects and or reactions from your medicines to your outpatient provider promptly. Patient is instructed and cautioned to not engage in alcohol and or illegal drug use while on prescription medicines. In the event of worsening symptoms, patient is instructed to call the crisis hotline, 911 and or go to the nearest ED for appropriate evaluation and  treatment of symptoms. Follow-up with your primary care provider for your other medical issues, concerns and or health care needs.   Total Discharge Time:  Greater than 30 minutes.  Signed: Sanjuana Kavawoko, Agnes I, PMHNP-BC 06/07/2014, 11:07 AM I personally assessed the patient and formulated the plan Madie RenoIrving A. Dub MikesLugo, M.D.

## 2014-06-07 NOTE — Progress Notes (Signed)
Pt reports he is doing fine today.  He says he is being [facility] the morning and he is a little anxious about that, but otherwise he is doing fine.  He is being transported by his mother who is picking him up at 7am.  He denies any withdrawal symptoms at this time.  He denies SI/HI/AV.  He had a visit with his children which lifted his mood.  Pt makes his needs known to staff.  Pt is pleasant/cooperative with staff.  Support and encouragement offered.  Safety maintained with q15 minute checks.

## 2014-06-08 DIAGNOSIS — F1020 Alcohol dependence, uncomplicated: Principal | ICD-10-CM

## 2014-06-08 NOTE — Progress Notes (Signed)
Pt discharged this morning to go to [facility] for [facility].  Discharge instructions reviewed with patient and belongings from locker returned.  Paperwork completed and signed.  Pt was given his medication supply and prescriptions.  Pt with positive/pleasant mood.  Pt was picked up by his mother to transport to [facility].

## 2014-06-13 NOTE — Progress Notes (Signed)
Patient Discharge Instructions:  After Visit Summary (AVS):   Faxed to:  06/13/14 Discharge Summary Note:   Faxed to:  06/13/14 Psychiatric Admission Assessment Note:   Faxed to:  06/13/14 Suicide Risk Assessment - Discharge Assessment:   Faxed to:  06/13/14 Faxed/Sent to the Next Level Care provider:  06/13/14 Faxed to [facility] @ [phone] Faxed to [facility] @ [phone]  Jerelene ReddenSheena E Friant, 06/13/2014, 3:16 PM

## 2014-06-17 ENCOUNTER — Emergency Department (HOSPITAL_BASED_OUTPATIENT_CLINIC_OR_DEPARTMENT_OTHER)
Admission: EM | Admit: 2014-06-17 | Discharge: 2014-06-17 | Disposition: A | Discharge status: Home / Self Care | Payer: Medicaid Other | Attending: Emergency Medicine | Admitting: Emergency Medicine

## 2014-06-17 ENCOUNTER — Encounter (HOSPITAL_BASED_OUTPATIENT_CLINIC_OR_DEPARTMENT_OTHER): Payer: Self-pay | Admitting: Emergency Medicine

## 2014-06-17 DIAGNOSIS — Z09 Encounter for follow-up examination after completed treatment for conditions other than malignant neoplasm: Secondary | ICD-10-CM

## 2014-06-17 DIAGNOSIS — F17200 Nicotine dependence, unspecified, uncomplicated: Secondary | ICD-10-CM | POA: Insufficient documentation

## 2014-06-17 DIAGNOSIS — Z862 Personal history of diseases of the blood and blood-forming organs and certain disorders involving the immune mechanism: Secondary | ICD-10-CM | POA: Insufficient documentation

## 2014-06-17 DIAGNOSIS — F411 Generalized anxiety disorder: Secondary | ICD-10-CM | POA: Insufficient documentation

## 2014-06-17 DIAGNOSIS — Z8639 Personal history of other endocrine, nutritional and metabolic disease: Secondary | ICD-10-CM | POA: Insufficient documentation

## 2014-06-17 DIAGNOSIS — Z79899 Other long term (current) drug therapy: Secondary | ICD-10-CM | POA: Insufficient documentation

## 2014-06-17 NOTE — ED Notes (Signed)
Pt reports [facility] for low blood [facility]. Was advised to get a check up in 2-3 days. Pt has no complaints at this time.

## 2014-06-17 NOTE — ED Provider Notes (Signed)
CSN: [id number]     Arrival date & time 06/17/14  1191 History   First MD Initiated Contact with Patient 06/17/14 [phone]     Chief Complaint  Patient presents with  . Follow-up     (Consider location/radiation/quality/duration/timing/severity/associated sxs/prior Treatment) HPI  This is a 33 year old male with history of substance abuse currently at [facility] status post detox who presents for blood pressure recheck. Patient states that he has been clean for 17 days. While in detox, he was started on blood pressure medications. 3 days ago he was seen at [facility] for low blood pressures. Per patient, his evaluation was reassuring.  His blood pressure medication was stopped. He was advised to have it rechecked in 2-3 days. Currently he has no complaints including chest pain, shortness breath, headache, fevers.  Past Medical History  Diagnosis Date  . Anxiety   . Substance abuse   . Heart palpitations   . Hypercholesteremia    History reviewed. No pertinent past surgical history. Family History  Problem Relation Age of Onset  . Anxiety disorder Mother   . Stroke Paternal Grandmother   . Cancer Paternal Grandmother    History  Substance Use Topics  . Smoking status: Current Every Day Smoker -- 1.00 packs/day for 16 years  . Smokeless tobacco: Not on file  . Alcohol Use: No    Review of Systems  Constitutional: Negative for fever.  Respiratory: Negative for chest tightness and shortness of breath.   Cardiovascular: Negative for chest pain.  Neurological: Negative for headaches.  All other systems reviewed and are negative.     Allergies  Bee venom  Home Medications   Prior to Admission medications   Medication Sig Start Date End Date Taking? Authorizing Provider  gabapentin (NEURONTIN) 300 MG capsule Take 1 capsule (300 mg total) by mouth 3 (three) times daily. For Substance withdrawal syndrome 06/07/14   Sanjuana Kava, NP  QUEtiapine (SEROQUEL) 25 MG tablet Take  1 tablet (25 mg total) by mouth 3 (three) times daily. For mood control/agitation 06/07/14   Sanjuana Kava, NP  traZODone (DESYREL) 50 MG tablet Take 1 tablet (50 mg total) by mouth at bedtime and may repeat dose one time if needed. For sleep 06/07/14   Sanjuana Kava, NP   BP 138/85  Pulse 103  Temp(Src) 98.7 F (37.1 C) (Oral)  Resp 18  Ht 5\' 8"  (1.727 m)  Wt 165 lb (74.844 kg)  BMI 25.09 kg/m2  SpO2 99% Physical Exam  Nursing note and vitals reviewed. Constitutional: He is oriented to person, place, and time. He appears well-developed and well-nourished. No distress.  HENT:  Head: Normocephalic and atraumatic.  Cardiovascular: Normal rate, regular rhythm and normal heart sounds.   Pulmonary/Chest: Effort normal and breath sounds normal. No respiratory distress. He has no wheezes.  Musculoskeletal: He exhibits no edema.  Lymphadenopathy:    He has no cervical adenopathy.  Neurological: He is alert and oriented to person, place, and time.  Skin: Skin is warm and dry.  Psychiatric: He has a normal mood and affect.    ED Course  Procedures (including critical care time) Labs Review Labs Reviewed - No data to display  Imaging Review No results found.   EKG Interpretation None      MDM   Final diagnoses:  Follow-up exam    Patient presents for blood pressure recheck. Blood pressure in triage 138/85. No systemic complaints at this time. He is medically cleared to return to [facility]  After history, exam, and medical workup I feel the patient has been appropriately medically screened and is safe for discharge home. Pertinent diagnoses were discussed with the patient. Patient was given return precautions.    Shon Batonourtney F Horton, MD 06/17/14 1013

## 2014-06-17 NOTE — Discharge Instructions (Signed)
You were seen today for followup. You were told to followup in 2-3 days for repeat blood pressure. Your blood pressure here is within normal range. Given the unknown her symptoms, you are medically cleared to [facility]  If you have any new or worsening symptoms, he should be evaluated further.

## 2015-03-16 ENCOUNTER — Encounter (HOSPITAL_COMMUNITY): Payer: Self-pay | Admitting: Emergency Medicine

## 2015-03-16 DIAGNOSIS — F1010 Alcohol abuse, uncomplicated: Secondary | ICD-10-CM | POA: Insufficient documentation

## 2015-03-16 DIAGNOSIS — F419 Anxiety disorder, unspecified: Secondary | ICD-10-CM | POA: Insufficient documentation

## 2015-03-16 DIAGNOSIS — R51 Headache: Secondary | ICD-10-CM | POA: Insufficient documentation

## 2015-03-16 DIAGNOSIS — Z720 Tobacco use: Secondary | ICD-10-CM | POA: Insufficient documentation

## 2015-03-16 DIAGNOSIS — Z8639 Personal history of other endocrine, nutritional and metabolic disease: Secondary | ICD-10-CM | POA: Insufficient documentation

## 2015-03-16 DIAGNOSIS — R635 Abnormal weight gain: Secondary | ICD-10-CM | POA: Insufficient documentation

## 2015-03-16 DIAGNOSIS — R197 Diarrhea, unspecified: Secondary | ICD-10-CM | POA: Insufficient documentation

## 2015-03-16 DIAGNOSIS — Z79899 Other long term (current) drug therapy: Secondary | ICD-10-CM | POA: Insufficient documentation

## 2015-03-16 DIAGNOSIS — R5383 Other fatigue: Secondary | ICD-10-CM | POA: Insufficient documentation

## 2015-03-16 DIAGNOSIS — R112 Nausea with vomiting, unspecified: Secondary | ICD-10-CM | POA: Insufficient documentation

## 2015-03-16 DIAGNOSIS — R748 Abnormal levels of other serum enzymes: Secondary | ICD-10-CM | POA: Insufficient documentation

## 2015-03-16 LAB — CBC WITH DIFFERENTIAL/PLATELET
BASOS ABS: 0 10*3/uL (ref 0.0–0.1)
Basophils Relative: 0 % (ref 0–1)
Eosinophils Absolute: 0 10*3/uL (ref 0.0–0.7)
Eosinophils Relative: 0 % (ref 0–5)
HCT: 44.2 % (ref 39.0–52.0)
HEMOGLOBIN: 15.3 g/dL (ref 13.0–17.0)
LYMPHS ABS: 2.8 10*3/uL (ref 0.7–4.0)
LYMPHS PCT: 38 % (ref 12–46)
MCH: 33.8 pg (ref 26.0–34.0)
MCHC: 34.6 g/dL (ref 30.0–36.0)
MCV: 97.8 fL (ref 78.0–100.0)
Monocytes Absolute: 0.7 10*3/uL (ref 0.1–1.0)
Monocytes Relative: 9 % (ref 3–12)
Neutro Abs: 3.9 10*3/uL (ref 1.7–7.7)
Neutrophils Relative %: 53 % (ref 43–77)
Platelets: 205 10*3/uL (ref 150–400)
RBC: 4.52 MIL/uL (ref 4.22–5.81)
RDW: 14.4 % (ref 11.5–15.5)
WBC: 7.5 10*3/uL (ref 4.0–10.5)

## 2015-03-16 LAB — URINALYSIS, ROUTINE W REFLEX MICROSCOPIC
BILIRUBIN URINE: NEGATIVE
Glucose, UA: NEGATIVE mg/dL
Ketones, ur: NEGATIVE mg/dL
Leukocytes, UA: NEGATIVE
Nitrite: NEGATIVE
PH: 6 (ref 5.0–8.0)
Protein, ur: NEGATIVE mg/dL
Specific Gravity, Urine: 1.034 — ABNORMAL HIGH (ref 1.005–1.030)
Urobilinogen, UA: 0.2 mg/dL (ref 0.0–1.0)

## 2015-03-16 LAB — URINE MICROSCOPIC-ADD ON

## 2015-03-16 NOTE — ED Notes (Signed)
Pt reports he has gained approx 50lbs over the past 3 months. Also he has been nauseated with diarrhea for the past 6 days.

## 2015-03-17 ENCOUNTER — Emergency Department (HOSPITAL_COMMUNITY)
Admission: EM | Admit: 2015-03-17 | Discharge: 2015-03-17 | Disposition: A | Discharge status: Home / Self Care | Payer: Self-pay | Attending: Emergency Medicine | Admitting: Emergency Medicine

## 2015-03-17 DIAGNOSIS — R112 Nausea with vomiting, unspecified: Secondary | ICD-10-CM

## 2015-03-17 DIAGNOSIS — R748 Abnormal levels of other serum enzymes: Secondary | ICD-10-CM

## 2015-03-17 DIAGNOSIS — F1010 Alcohol abuse, uncomplicated: Secondary | ICD-10-CM

## 2015-03-17 LAB — LIPASE, BLOOD: Lipase: 28 U/L (ref 11–59)

## 2015-03-17 LAB — COMPREHENSIVE METABOLIC PANEL
ALT: 93 U/L — ABNORMAL HIGH (ref 0–53)
ANION GAP: 9 (ref 5–15)
AST: 106 U/L — ABNORMAL HIGH (ref 0–37)
Albumin: 3.9 g/dL (ref 3.5–5.2)
Alkaline Phosphatase: 91 U/L (ref 39–117)
BILIRUBIN TOTAL: 0.5 mg/dL (ref 0.3–1.2)
BUN: 15 mg/dL (ref 6–23)
CHLORIDE: 101 mmol/L (ref 96–112)
CO2: 28 mmol/L (ref 19–32)
Calcium: 8.6 mg/dL (ref 8.4–10.5)
Creatinine, Ser: 0.96 mg/dL (ref 0.50–1.35)
GFR calc non Af Amer: >90 mL/min (ref >90)
GLUCOSE: 90 mg/dL (ref 70–99)
Potassium: 3.7 mmol/L (ref 3.5–5.1)
Sodium: 138 mmol/L (ref 135–145)
Total Protein: 7.3 g/dL (ref 6.0–8.3)

## 2015-03-17 MED ORDER — FAMOTIDINE 40 MG PO TABS: 40.0000 mg | ORAL_TABLET | Freq: Every day | ORAL | Status: DC

## 2015-03-17 MED ORDER — ONDANSETRON 8 MG PO TBDP
8.0000 mg | ORAL_TABLET | Freq: Three times a day (TID) | ORAL | Status: DC | PRN
Start: 2015-03-17 — End: 2016-01-03

## 2015-03-17 MED ORDER — ONDANSETRON 4 MG PO TBDP
8.0000 mg | ORAL_TABLET | Freq: Once | ORAL | Status: AC
  Administered 2015-03-17: 8 mg via ORAL
  Filled 2015-03-17: qty 2

## 2015-03-17 MED ORDER — FAMOTIDINE 20 MG PO TABS
40.0000 mg | ORAL_TABLET | Freq: Once | ORAL | Status: AC
  Administered 2015-03-17: 40 mg via ORAL
  Filled 2015-03-17: qty 2

## 2015-03-17 NOTE — ED Provider Notes (Signed)
CSN: [id number]     Arrival date & time 03/16/15  2304 History  This chart was scribed for Frederick Severinlga Sotiria Keast, MD by Annye AsaAnna Dorsett, ED Scribe. This patient was seen in room D31C/D31C and the patient's care was started at 1:24 AM.    Chief Complaint  Patient presents with  . Weight Gain  . Nausea   The history is provided by the patient. No language interpreter was used.     HPI Comments: Darvin NeighboursChristopher J Oats is a 34 y.o. male with past medical history of anxiety, substance abuse, heart palpitations, HLD who presents to the Emergency Department complaining of 6 days of nausea, vomiting (2-3x, mostly stomach contents, occasionally dry heaves). Patient explains his vomiting is worse in the morning; "it will improve when I lay back but it's still there." He also notes two weeks of diarrhea (1x per day). He reports 50lbs of weight gain within the past 4 months and fatigue, which he states is abnormal for him. He notes occasional headaches. He denies abdominal pain, personal history of stomach ulcers or GERD.   Patient is a 1ppd smoker. He has been taking methadone for the past 2-3 months. He is a regular EtOH user but reports that he has been trying to cut back for the past month; "when I wake up in the morning, I'm shaky until I have a drink." No PCP at this time. He is no longer taking alprazolam, seroquel.  Past Medical History  Diagnosis Date  . Anxiety   . Substance abuse   . Heart palpitations   . Hypercholesteremia    History reviewed. No pertinent past surgical history. Family History  Problem Relation Age of Onset  . Anxiety disorder Mother   . Stroke Paternal Grandmother   . Cancer Paternal Grandmother    History  Substance Use Topics  . Smoking status: Current Every Day Smoker -- 1.00 packs/day for 16 years  . Smokeless tobacco: Not on file  . Alcohol Use: No    Review of Systems  Constitutional: Positive for fatigue and unexpected weight change.  Gastrointestinal: Positive for  nausea, vomiting and diarrhea. Negative for abdominal pain.  Neurological: Positive for headaches.  All other systems reviewed and are negative.   Allergies  Bee venom  Home Medications   Prior to Admission medications   Medication Sig Start Date End Date Taking? Authorizing Provider  methadone (DOLOPHINE) 10 MG/ML solution Take 95 mg by mouth daily.   Yes Historical Provider, MD  gabapentin (NEURONTIN) 300 MG capsule Take 1 capsule (300 mg total) by mouth 3 (three) times daily. For Substance withdrawal syndrome Patient not taking: Reported on 03/17/2015 06/07/14   Sanjuana KavaAgnes I Nwoko, NP  QUEtiapine (SEROQUEL) 25 MG tablet Take 1 tablet (25 mg total) by mouth 3 (three) times daily. For mood control/agitation Patient not taking: Reported on 03/17/2015 06/07/14   Sanjuana KavaAgnes I Nwoko, NP  traZODone (DESYREL) 50 MG tablet Take 1 tablet (50 mg total) by mouth at bedtime and may repeat dose one time if needed. For sleep Patient not taking: Reported on 03/17/2015 06/07/14   Sanjuana KavaAgnes I Nwoko, NP   BP 126/74 mmHg  Pulse 85  Temp(Src) 98.1 F (36.7 C) (Oral)  Resp 18  Ht 5\' 9"  (1.753 m)  Wt 213 lb 11.2 oz (96.934 kg)  BMI 31.54 kg/m2  SpO2 93% Physical Exam  Constitutional: He is oriented to person, place, and time. He appears well-developed and well-nourished. No distress.  HENT:  Head: Normocephalic and atraumatic.  Mouth/Throat:  Oropharynx is clear and moist. No oropharyngeal exudate.  Moist mucous membranes  Eyes: EOM are normal. Pupils are equal, round, and reactive to light.  Neck: Normal range of motion. Neck supple. No JVD present.  Cardiovascular: Normal rate, regular rhythm and normal heart sounds.  Exam reveals no gallop and no friction rub.   No murmur heard. Pulmonary/Chest: Effort normal and breath sounds normal. No respiratory distress. He has no wheezes. He has no rales.  Abdominal: Soft. Bowel sounds are normal. He exhibits no mass. There is no tenderness. There is no rebound and no guarding.   Musculoskeletal: Normal range of motion. He exhibits no edema.  Moves all extremities normally.   Lymphadenopathy:    He has no cervical adenopathy.  Neurological: He is alert and oriented to person, place, and time. He displays normal reflexes.  Skin: Skin is warm and dry. No rash noted.  Psychiatric: He has a normal mood and affect. His behavior is normal.  Nursing note and vitals reviewed.   ED Course  Procedures   DIAGNOSTIC STUDIES: Oxygen Saturation is 94% on RA, low by my interpretation.    COORDINATION OF CARE: 1:38 AM Discussed treatment plan with pt at bedside and pt agreed to plan.   Labs Review Labs Reviewed  COMPREHENSIVE METABOLIC PANEL - Abnormal; Notable for the following:    AST 106 (*)    ALT 93 (*)    All other components within normal limits  URINALYSIS, ROUTINE W REFLEX MICROSCOPIC - Abnormal; Notable for the following:    APPearance CLOUDY (*)    Specific Gravity, Urine 1.034 (*)    Hgb urine dipstick TRACE (*)    All other components within normal limits  CBC WITH DIFFERENTIAL/PLATELET  LIPASE, BLOOD  URINE MICROSCOPIC-ADD ON    Imaging Review No results found.   EKG Interpretation None      MDM   Final diagnoses:  Nausea and vomiting, vomiting of unspecified type  Alcohol abuse  Elevated liver enzymes   I personally performed the services described in this documentation, which was scribed in my presence. The recorded information has been reviewed and is accurate.  34 year old male with a week of nausea and vomiting, 2-3 weeks of loose stools.  Patient reports he has been under more stress recently and has been drinking more over the last month.  He also reports a 50 pound weight gain over the last 3 months.  Labs show mild elevation in LFTs.  Patient counseled on alcohol cessation.  Physical exam unremarkable.  Plan for referral to primary care doctor.  Will start on Pepcid for any associated alcohol gastritis MIP contribute to his nausea  and vomiting.  Patient also be given prescription for Zofran.   Frederick Severin, MD 03/17/15 [phone]

## 2015-03-17 NOTE — ED Notes (Signed)
Patient presents stating that he has been having constant nausea for the past 6 days.  +diarrhea for the past 2 days off and on.

## 2015-03-17 NOTE — ED Notes (Signed)
Discharge instructions and prescriptions reviewed, voiced understanding. 

## 2015-03-17 NOTE — Discharge Instructions (Signed)
Alcohol Use Disorder °Alcohol use disorder is a mental disorder. It is not a one-time incident of heavy drinking. Alcohol use disorder is the excessive and uncontrollable use of alcohol over time that leads to problems with functioning in one or more areas of daily living. People with this disorder risk harming themselves and others when they drink to excess. Alcohol use disorder also can cause other mental disorders, such as mood and anxiety disorders, and serious physical problems. People with alcohol use disorder often misuse other drugs.  °Alcohol use disorder is common and widespread. Some people with this disorder drink alcohol to cope with or escape from negative life events. Others drink to relieve chronic pain or symptoms of mental illness. People with a family history of alcohol use disorder are at higher risk of losing control and using alcohol to excess.  °SYMPTOMS  °Signs and symptoms of alcohol use disorder may include the following:  °· Consumption of alcohol in larger amounts or over a longer period of time than intended. °· Multiple unsuccessful attempts to cut down or control alcohol use.   °· A great deal of time spent obtaining alcohol, using alcohol, or recovering from the effects of alcohol (hangover). °· A strong desire or urge to use alcohol (cravings).   °· Continued use of alcohol despite problems at work, school, or home because of alcohol use.   °· Continued use of alcohol despite problems in relationships because of alcohol use. °· Continued use of alcohol in situations when it is physically hazardous, such as driving a car. °· Continued use of alcohol despite awareness of a physical or psychological problem that is likely related to alcohol use. Physical problems related to alcohol use can involve the brain, heart, liver, stomach, and intestines. Psychological problems related to alcohol use include intoxication, depression, anxiety, psychosis, delirium, and dementia.   °· The need for  increased amounts of alcohol to achieve the same desired effect, or a decreased effect from the consumption of the same amount of alcohol (tolerance). °· Withdrawal symptoms upon reducing or stopping alcohol use, or alcohol use to reduce or avoid withdrawal symptoms. Withdrawal symptoms include: °· Racing heart. °· Hand tremor. °· Difficulty sleeping. °· Nausea. °· Vomiting. °· Hallucinations. °· Restlessness. °· Seizures. °DIAGNOSIS °Alcohol use disorder is diagnosed through an assessment by your health care provider. Your health care provider may start by asking three or four questions to screen for excessive or problematic alcohol use. To confirm a diagnosis of alcohol use disorder, at least two symptoms must be present within a 12-month period. The severity of alcohol use disorder depends on the number of symptoms: °· Mild--two or three. °· Moderate--four or five. °· Severe--six or more. °Your health care provider may perform a physical exam or use results from lab tests to see if you have physical problems resulting from alcohol use. Your health care provider may refer you to a mental health professional for evaluation. °TREATMENT  °Some people with alcohol use disorder are able to reduce their alcohol use to low-risk levels. Some people with alcohol use disorder need to quit drinking alcohol. When necessary, mental health professionals with specialized training in substance use treatment can help. Your health care provider can help you decide how severe your alcohol use disorder is and what type of treatment you need. The following forms of treatment are available:  °· Detoxification. Detoxification involves the use of prescription medicines to prevent alcohol withdrawal symptoms in the first week after quitting. This is important for people with a history of symptoms   of withdrawal and for heavy drinkers who are likely to have withdrawal symptoms. Alcohol withdrawal can be dangerous and, in severe cases, cause  death. Detoxification is usually provided in a hospital or in-patient substance use treatment facility.  Counseling or talk therapy. Talk therapy is provided by substance use treatment counselors. It addresses the reasons people use alcohol and ways to keep them from drinking again. The goals of talk therapy are to help people with alcohol use disorder find healthy activities and ways to cope with life stress, to identify and avoid triggers for alcohol use, and to handle cravings, which can cause relapse.  Medicines.Different medicines can help treat alcohol use disorder through the following actions:  Decrease alcohol cravings.  Decrease the positive reward response felt from alcohol use.  Produce an uncomfortable physical reaction when alcohol is used (aversion therapy).  Support groups. Support groups are run by people who have quit drinking. They provide emotional support, advice, and guidance. These forms of treatment are often combined. Some people with alcohol use disorder benefit from intensive combination treatment provided by specialized substance use treatment centers. Both inpatient and outpatient treatment programs are available. Document Released: 01/02/2005 Document Revised: 04/11/2014 Document Reviewed: 03/04/2013 [facility] Patient Information 2015 [place], [place]. This information is not intended to replace advice given to you by your health care provider. Make sure you discuss any questions you have with your health care provider.  Alcohol and Nutrition Nutrition serves two purposes. It provides energy. It also maintains body structure and function. Food supplies energy. It also provides the building blocks needed to replace worn or damaged cells. Alcoholics often eat poorly. This limits their supply of essential nutrients. This affects energy supply and structure maintenance. Alcohol also affects the body's nutrients in:  Digestion.  Storage.  Using and getting rid of waste  products. IMPAIRMENT OF NUTRIENT DIGESTION AND UTILIZATION   Once ingested, food must be broken down into small components (digested). Then it is available for energy. It helps maintain body structure and function. Digestion begins in the mouth. It continues in the stomach and intestines, with help from the pancreas. The nutrients from digested food are absorbed from the intestines into the blood. Then they are carried to the liver. The liver prepares nutrients for:  Immediate use.  Storage and future use.  Alcohol inhibits the breakdown of nutrients into usable molecules.  It decreases secretion of digestive enzymes from the pancreas.  Alcohol impairs nutrient absorption by damaging the cells lining the stomach and intestines.  It also interferes with moving some nutrients into the blood.  In addition, nutritional deficiencies themselves may lead to further absorption problems.  For example, folate deficiency changes the cells that line the small intestine. This impairs how water is absorbed. It also affects absorbed nutrients. These include glucose, sodium, and additional folate.  Even if nutrients are digested and absorbed, alcohol can prevent them from being fully used. It changes their transport, storage, and excretion. Impaired utilization of nutrients by alcoholics is indicated by:  Decreased liver stores of vitamins, such as vitamin A.  Increased excretion of nutrients such as fat. ALCOHOL AND ENERGY SUPPLY   Three basic nutritional components found in food are:  Carbohydrates.  Proteins.  Fats.  These are used as energy. Some alcoholics take in as much as 50% of their total daily calories from alcohol. They often neglect important foods.  Even when enough food is eaten, alcohol can impair the ways the body controls blood sugar (glucose) levels. It  may either increase or decrease blood sugar.  In non-diabetic alcoholics, increased blood sugar (hyperglycemia) is caused  by poor insulin secretion. It is usually temporary.  Decreased blood sugar (hypoglycemia) can cause serious injury even if this condition is short-lived. Low blood sugar can happen when a fasting or malnourished person drinks alcohol. When there is no food to supply energy, stored sugar is used up. The products of alcohol inhibit forming glucose from other compounds such as amino acids. As a result, alcohol causes the brain and other body tissue to lack glucose. It is needed for energy and function.  Alcohol is an energy source. But how the body processes and uses the energy from alcohol is complex. Also, when alcohol is substituted for carbohydrates, subjects tend to lose weight. This indicates that they get less energy from alcohol than from food. ALCOHOL - MAINTAINING CELL STRUCTURE AND FUNCTION  Structure Cells are made mostly of protein. So an adequate protein diet is important for maintaining cell structure. This is especially true if cells are being damaged. Research indicates that alcohol affects protein nutrition by causing impaired:  Digestion of proteins to amino acids.  Processing of amino acids by the small intestine and liver.  Synthesis of proteins from amino acids.  Protein secretion by the liver. Function Nutrients are essential for the body to function well. They provide the tools that the body needs to work well:   Proteins.  Vitamins.  Minerals. Alcohol can disrupt body function. It may cause nutrient deficiencies. And it may interfere with the way nutrients are processed. Vitamins  Vitamins are essential to maintain growth and normal metabolism. They regulate many of the body`s processes. Chronic heavy drinking causes deficiencies in many vitamins. This is caused by eating less. And, in some cases, vitamins may be poorly absorbed. For example, alcohol inhibits fat absorption. It impairs how the vitamins A, E, and D are normally absorbed along with dietary fats. Not  enough vitamin A may cause night blindness. Not enough vitamin D may cause softening of the bones.  Some alcoholics lack vitamins A, C, D, E, K, and the B vitamins. These are all involved in wound healing and cell maintenance. In particular, because vitamin K is necessary for blood clotting, lacking that vitamin can cause delayed clotting. The result is excess bleeding. Lacking other vitamins involved in brain function may cause severe neurological damage. Minerals Deficiencies of minerals such as calcium, magnesium, iron, and zinc are common in alcoholics. The alcohol itself does not seem to affect how these minerals are absorbed. Rather, they seem to occur secondary to other alcohol-related problems, such as:  Less calcium absorbed.  Not enough magnesium.  More urinary excretion.  Vomiting.  Diarrhea.  Not enough iron due to gastrointestinal bleeding.  Not enough zinc or losses related to other nutrient deficiencies.  Mineral deficiencies can cause a variety of medical consequences. These range from calcium-related bone disease to zinc-related night blindness and skin lesions. ALCOHOL, MALNUTRITION, AND MEDICAL COMPLICATIONS  Liver Disease   Alcoholic liver damage is caused primarily by alcohol itself. But poor nutrition may increase the risk of alcohol-related liver damage. For example, nutrients normally found in the liver are known to be affected by drinking alcohol. These include carotenoids, which are the major sources of vitamin A, and vitamin E compounds. Decreases in such nutrients may play some role in alcohol-related liver damage. Pancreatitis  Research suggests that malnutrition may increase the risk of developing alcoholic pancreatitis. Research suggests that a diet lacking  in protein may increase alcohol's damaging effect on the pancreas. Brain  Nutritional deficiencies may have severe effects on brain function. These may be permanent. Specifically, thiamine deficiencies  are often seen in alcoholics. They can cause severe neurological problems. These include:  Impaired movement.  Memory loss seen in Wernicke-Korsakoff syndrome. Pregnancy  Alcohol has toxic effects on fetal development. It causes alcohol-related birth defects. They include fetal alcohol syndrome. Alcohol itself is toxic to the fetus. Also, the nutritional deficiency can affect how the fetus develops. That may compound the risk of developmental damage.  Nutritional needs during pregnancy are 10% to 30% greater than normal. Food intake can increase by as much as 140% to cover the needs of both mother and fetus. An alcoholic mother`s nutritional problems may adversely affect the nutrition of the fetus. And alcohol itself can also restrict nutrition flow to the fetus. NUTRITIONAL STATUS OF ALCOHOLICS  Techniques for assessing nutritional status include:  Taking body measurements to estimate fat reserves. They include:  Weight.  Height.  Mass.  Skin fold thickness.  Performing blood analysis to provide measurements of circulating:  Proteins.  Vitamins.  Minerals.  These techniques tend to be imprecise. For many nutrients, there is no clear "cut-off" point that would allow an accurate definition of deficiency. So assessing the nutritional status of alcoholics is limited by these techniques. Dietary status may provide information about the risk of developing nutritional problems. Dietary status is assessed by:  Taking patients' dietary histories.  Evaluating the amount and types of food they are eating.  It is difficult to determine what exact amount of alcohol begins to have damaging effects on nutrition. In general, moderate drinkers have 2 drinks or less per day. They seem to be at little risk for nutritional problems. Various medical disorders begin to appear at greater levels.  Research indicates that the majority of even the heaviest drinkers have few obvious nutritional  deficiencies. Many alcoholics who are hospitalized for medical complications of their disease do have severe malnutrition. Alcoholics tend to eat poorly. Often they eat less than the amounts of food necessary to provide enough:  Carbohydrates.  Protein.  Fat.  Vitamins A and C.  B vitamins.  Minerals like calcium and iron. Of major concern is alcohol's effect on digesting food and use of nutrients. It may shift a mildly malnourished person toward severe malnutrition. Document Released: 09/19/2005 Document Revised: 02/17/2012 Document Reviewed: 03/05/2006 [facility] Patient Information 2015 [place], [place]. This information is not intended to replace advice given to you by your health care provider. Make sure you discuss any questions you have with your health care provider.  Alcohol Withdrawal Anytime drug use is interfering with normal living activities it has become abuse. This includes problems with family and friends. Psychological dependence has developed when your mind tells you that the drug is needed. This is usually followed by physical dependence when a continuing increase of drugs are required to get the same feeling or "high." This is known as addiction or chemical dependency. A person's risk is much higher if there is a history of chemical dependency in the family. Mild Withdrawal Following Stopping Alcohol, When Addiction or Chemical Dependency Has Developed When a person has developed tolerance to alcohol, any sudden stopping of alcohol can cause uncomfortable physical symptoms. Most of the time these are mild and consist of tremors in the hands and increases in heart rate, breathing, and temperature. Sometimes these symptoms are associated with anxiety, panic attacks, and bad dreams. There may  also be stomach upset. Normal sleep patterns are often interrupted with periods of inability to sleep (insomnia). This may last for 6 months. Because of this discomfort, many people choose to  continue drinking to get rid of this discomfort and to try to feel normal. Severe Withdrawal with Decreased or No Alcohol Intake, When Addiction or Chemical Dependency Has Developed About five percent of alcoholics will develop signs of severe withdrawal when they stop using alcohol. One sign of this is development of generalized seizures (convulsions). Other signs of this are severe agitation and confusion. This may be associated with believing in things which are not real or seeing things which are not really there (delusions and hallucinations). Vitamin deficiencies are usually present if alcohol intake has been long-term. Treatment for this most often requires hospitalization and close observation. Addiction can only be helped by stopping use of all chemicals. This is hard but may save your life. With continual alcohol use, possible outcomes are usually loss of self respect and esteem, violence, and death. Addiction cannot be cured but it can be stopped. This often requires outside help and the care of professionals. Treatment centers are listed in the yellow pages under Cocaine, Narcotics, and Alcoholics Anonymous. Most hospitals and clinics can refer you to a specialized care center. It is not necessary for you to go through the uncomfortable symptoms of withdrawal. Your caregiver can provide you with medicines that will help you through this difficult period. Try to avoid situations, friends, or drugs that made it possible for you to keep using alcohol in the past. Learn how to say no. It takes a long period of time to overcome addictions to all drugs, including alcohol. There may be many times when you feel as though you want a drink. After getting rid of the physical addiction and withdrawal, you will have a lessening of the craving which tells you that you need alcohol to feel normal. Call your caregiver if more support is needed. Learn who to talk to in your family and among your friends so that  during these periods you can receive outside help. Alcoholics Anonymous (AA) has helped many people over the years. To get further help, contact AA or call your caregiver, counselor, or clergyperson. Al-Anon and Alateen are support groups for friends and family members of an alcoholic. The people who love and care for an alcoholic often need help, too. For information about these organizations, check your phone directory or call a local alcoholism treatment center.  SEEK IMMEDIATE MEDICAL CARE IF:   You have a seizure.  You have a fever.  You experience uncontrolled vomiting or you vomit up blood. This may be bright red or look like black coffee grounds.  You have blood in the stool. This may be bright red or appear as a black, tarry, bad-smelling stool.  You become lightheaded or faint. Do not drive if you feel this way. Have someone else drive you or call [phone] for help.  You become more agitated or confused.  You develop uncontrolled anxiety.  You begin to see things that are not really there (hallucinate). Your caregiver has determined that you completely understand your medical condition, and that your mental state is back to normal. You understand that you have been treated for alcohol withdrawal, have agreed not to drink any alcohol for a minimum of 1 day, will not operate a car or other machinery for 24 hours, and have had an opportunity to ask any questions about your condition.  Document Released: 09/04/2005 Document Revised: 02/17/2012 Document Reviewed: 07/13/2008 [facility] Patient Information 2015 [place], [place]. This information is not intended to replace advice given to you by your health care provider. Make sure you discuss any questions you have with your health care provider.   Food Choices to Help Relieve Diarrhea When you have diarrhea, the foods you eat and your eating habits are very important. Choosing the right foods and drinks can help relieve diarrhea. Also, because  diarrhea can last up to 7 days, you need to replace lost fluids and electrolytes (such as sodium, potassium, and chloride) in order to help prevent dehydration.  WHAT GENERAL GUIDELINES DO I NEED TO FOLLOW?  Slowly drink 1 cup (8 oz) of fluid for each episode of diarrhea. If you are getting enough fluid, your urine will be clear or pale yellow.  Eat starchy foods. Some good choices include white rice, white toast, pasta, low-fiber cereal, baked potatoes (without the skin), saltine crackers, and bagels.  Avoid large servings of any cooked vegetables.  Limit fruit to two servings per day. A serving is  cup or 1 small piece.  Choose foods with less than 2 g of fiber per serving.  Limit fats to less than 8 tsp (38 g) per day.  Avoid fried foods.  Eat foods that have probiotics in them. Probiotics can be found in certain dairy products.  Avoid foods and beverages that may increase the speed at which food moves through the stomach and intestines (gastrointestinal tract). Things to avoid include:  High-fiber foods, such as dried fruit, raw fruits and vegetables, nuts, seeds, and whole grain foods.  Spicy foods and high-fat foods.  Foods and beverages sweetened with high-fructose corn syrup, honey, or sugar alcohols such as xylitol, sorbitol, and mannitol. WHAT FOODS ARE RECOMMENDED? Grains White rice. White, [place], or pita breads (fresh or toasted), including plain rolls, buns, or bagels. White pasta. Saltine, soda, or graham crackers. Pretzels. Low-fiber cereal. Cooked cereals made with water (such as cornmeal, farina, or cream cereals). Plain muffins. Matzo. Melba toast. Zwieback.  Vegetables Potatoes (without the skin). Strained tomato and vegetable juices. Most well-cooked and canned vegetables without seeds. Tender lettuce. Fruits Cooked or canned applesauce, apricots, cherries, fruit cocktail, grapefruit, peaches, pears, or plums. Fresh bananas, apples without skin, cherries, grapes,  cantaloupe, grapefruit, peaches, oranges, or plums.  Meat and Other Protein Products Baked or boiled chicken. Eggs. Tofu. Fish. Seafood. Smooth peanut butter. Ground or well-cooked tender beef, ham, veal, lamb, pork, or poultry.  Dairy Plain yogurt, kefir, and unsweetened liquid yogurt. Lactose-free milk, buttermilk, or soy milk. Plain hard cheese. Beverages Sport drinks. Clear broths. Diluted fruit juices (except prune). Regular, caffeine-free sodas such as ginger ale. Water. Decaffeinated teas. Oral rehydration solutions. Sugar-free beverages not sweetened with sugar alcohols. Other Bouillon, broth, or soups made from recommended foods.  The items listed above may not be a complete list of recommended foods or beverages. Contact your dietitian for more options. WHAT FOODS ARE NOT RECOMMENDED? Grains Whole grain, whole wheat, bran, or rye breads, rolls, pastas, crackers, and cereals. Wild or brown rice. Cereals that contain more than 2 g of fiber per serving. Corn tortillas or taco shells. Cooked or dry oatmeal. Granola. Popcorn. Vegetables Raw vegetables. Cabbage, broccoli, Brussels sprouts, artichokes, baked beans, beet greens, corn, kale, legumes, peas, sweet potatoes, and yams. Potato skins. Cooked spinach and cabbage. Fruits Dried fruit, including raisins and dates. Raw fruits. Stewed or dried prunes. Fresh apples with skin, apricots, mangoes, pears, raspberries,  and strawberries.  Meat and Other Protein Products Chunky peanut butter. Nuts and seeds. Beans and lentils. Tomasa Blase.  Dairy High-fat cheeses. Milk, chocolate milk, and beverages made with milk, such as milk shakes. Cream. Ice cream. Sweets and Desserts Sweet rolls, doughnuts, and sweet breads. Pancakes and waffles. Fats and Oils Butter. Cream sauces. Margarine. Salad oils. Plain salad dressings. Olives. Avocados.  Beverages Caffeinated beverages (such as coffee, tea, soda, or energy drinks). Alcoholic beverages. Fruit juices  with pulp. Prune juice. Soft drinks sweetened with high-fructose corn syrup or sugar alcohols. Other Coconut. Hot sauce. Chili powder. Mayonnaise. Gravy. Cream-based or milk-based soups.  The items listed above may not be a complete list of foods and beverages to avoid. Contact your dietitian for more information. WHAT SHOULD I DO IF I BECOME DEHYDRATED? Diarrhea can sometimes lead to dehydration. Signs of dehydration include dark urine and dry mouth and skin. If you think you are dehydrated, you should rehydrate with an oral rehydration solution. These solutions can be purchased at pharmacies, retail stores, or online.  Drink -1 cup (120-240 mL) of oral rehydration solution each time you have an episode of diarrhea. If drinking this amount makes your diarrhea worse, try drinking smaller amounts more often. For example, drink 1-3 tsp (5-15 mL) every 5-10 minutes.  A general rule for staying hydrated is to drink 1-2 L of fluid per day. Talk to your health care provider about the specific amount you should be drinking each day. Drink enough fluids to keep your urine clear or pale yellow. Document Released: 02/15/2004 Document Revised: 11/30/2013 Document Reviewed: 10/18/2013 [facility] Patient Information 2015 [place], [place]. This information is not intended to replace advice given to you by your health care provider. Make sure you discuss any questions you have with your health care provider.  Nausea and Vomiting Nausea is a sick feeling that often comes before throwing up (vomiting). Vomiting is a reflex where stomach contents come out of your mouth. Vomiting can cause severe loss of body fluids (dehydration). Children and elderly adults can become dehydrated quickly, especially if they also have diarrhea. Nausea and vomiting are symptoms of a condition or disease. It is important to find the cause of your symptoms. CAUSES   Direct irritation of the stomach lining. This irritation can result from  increased acid production (gastroesophageal reflux disease), infection, food poisoning, taking certain medicines (such as nonsteroidal anti-inflammatory drugs), alcohol use, or tobacco use.  Signals from the brain.These signals could be caused by a headache, heat exposure, an inner ear disturbance, increased pressure in the brain from injury, infection, a tumor, or a concussion, pain, emotional stimulus, or metabolic problems.  An obstruction in the gastrointestinal tract (bowel obstruction).  Illnesses such as diabetes, hepatitis, gallbladder problems, appendicitis, kidney problems, cancer, sepsis, atypical symptoms of a heart attack, or eating disorders.  Medical treatments such as chemotherapy and radiation.  Receiving medicine that makes you sleep (general anesthetic) during surgery. DIAGNOSIS Your caregiver may ask for tests to be done if the problems do not improve after a few days. Tests may also be done if symptoms are severe or if the reason for the nausea and vomiting is not clear. Tests may include:  Urine tests.  Blood tests.  Stool tests.  Cultures (to look for evidence of infection).  X-rays or other imaging studies. Test results can help your caregiver make decisions about treatment or the need for additional tests. TREATMENT You need to stay well hydrated. Drink frequently but in small amounts.You may  wish to drink water, sports drinks, clear broth, or eat frozen ice pops or gelatin dessert to help stay hydrated.When you eat, eating slowly may help prevent nausea.There are also some antinausea medicines that may help prevent nausea. HOME CARE INSTRUCTIONS   Take all medicine as directed by your caregiver.  If you do not have an appetite, do not force yourself to eat. However, you must continue to drink fluids.  If you have an appetite, eat a normal diet unless your caregiver tells you differently.  Eat a variety of complex carbohydrates (rice, wheat, potatoes,  bread), lean meats, yogurt, fruits, and vegetables.  Avoid high-fat foods because they are more difficult to digest.  Drink enough water and fluids to keep your urine clear or pale yellow.  If you are dehydrated, ask your caregiver for specific rehydration instructions. Signs of dehydration may include:  Severe thirst.  Dry lips and mouth.  Dizziness.  Dark urine.  Decreasing urine frequency and amount.  Confusion.  Rapid breathing or pulse. SEEK IMMEDIATE MEDICAL CARE IF:   You have blood or brown flecks (like coffee grounds) in your vomit.  You have black or bloody stools.  You have a severe headache or stiff neck.  You are confused.  You have severe abdominal pain.  You have chest pain or trouble breathing.  You do not urinate at least once every 8 hours.  You develop cold or clammy skin.  You continue to vomit for longer than 24 to 48 hours.  You have a fever. MAKE SURE YOU:   Understand these instructions.  Will watch your condition.  Will get help right away if you are not doing well or get worse. Document Released: 11/25/2005 Document Revised: 02/17/2012 Document Reviewed: 04/24/2011 [facility] Patient Information 2015 [place], [place]. This information is not intended to replace advice given to you by your health care provider. Make sure you discuss any questions you have with your health care provider.

## 2015-06-28 ENCOUNTER — Emergency Department (HOSPITAL_COMMUNITY)
Admission: EM | Admit: 2015-06-28 | Discharge: 2015-06-28 | Disposition: A | Discharge status: Home / Self Care | Payer: Self-pay | Attending: Emergency Medicine | Admitting: Emergency Medicine

## 2015-06-28 ENCOUNTER — Encounter (HOSPITAL_COMMUNITY): Payer: Self-pay | Admitting: Emergency Medicine

## 2015-06-28 DIAGNOSIS — R000 Tachycardia, unspecified: Secondary | ICD-10-CM | POA: Insufficient documentation

## 2015-06-28 DIAGNOSIS — F419 Anxiety disorder, unspecified: Secondary | ICD-10-CM | POA: Insufficient documentation

## 2015-06-28 DIAGNOSIS — Z720 Tobacco use: Secondary | ICD-10-CM | POA: Insufficient documentation

## 2015-06-28 DIAGNOSIS — N492 Inflammatory disorders of scrotum: Secondary | ICD-10-CM | POA: Insufficient documentation

## 2015-06-28 DIAGNOSIS — Z8639 Personal history of other endocrine, nutritional and metabolic disease: Secondary | ICD-10-CM | POA: Insufficient documentation

## 2015-06-28 DIAGNOSIS — Z79899 Other long term (current) drug therapy: Secondary | ICD-10-CM | POA: Insufficient documentation

## 2015-06-28 LAB — URINALYSIS, ROUTINE W REFLEX MICROSCOPIC
Bilirubin Urine: NEGATIVE
Glucose, UA: NEGATIVE mg/dL
Hgb urine dipstick: NEGATIVE
Ketones, ur: NEGATIVE mg/dL
LEUKOCYTES UA: NEGATIVE
Nitrite: NEGATIVE
Protein, ur: NEGATIVE mg/dL
SPECIFIC GRAVITY, URINE: 1.021 (ref 1.005–1.030)
Urobilinogen, UA: 0.2 mg/dL (ref 0.0–1.0)
pH: 5.5 (ref 5.0–8.0)

## 2015-06-28 MED ORDER — CLINDAMYCIN HCL 300 MG PO CAPS: 300.0000 mg | ORAL_CAPSULE | Freq: Three times a day (TID) | ORAL | Status: AC

## 2015-06-28 MED ORDER — OXYCODONE HCL 5 MG PO TABS: 5.0000 mg | ORAL_TABLET | ORAL | Status: DC | PRN

## 2015-06-28 MED ORDER — HYDROMORPHONE HCL 1 MG/ML IJ SOLN
1.0000 mg | Freq: Once | INTRAMUSCULAR | Status: AC
  Administered 2015-06-28: 1 mg via INTRAMUSCULAR
  Filled 2015-06-28: qty 1

## 2015-06-28 MED ORDER — KETOROLAC TROMETHAMINE 60 MG/2ML IM SOLN
60.0000 mg | Freq: Once | INTRAMUSCULAR | Status: AC
  Administered 2015-06-28: 60 mg via INTRAMUSCULAR
  Filled 2015-06-28: qty 2

## 2015-06-28 MED ORDER — CLINDAMYCIN HCL 300 MG PO CAPS
300.0000 mg | ORAL_CAPSULE | Freq: Once | ORAL | Status: AC
  Administered 2015-06-28: 300 mg via ORAL
  Filled 2015-06-28: qty 1

## 2015-06-28 NOTE — Discharge Instructions (Signed)
Cellulitis °Cellulitis is an infection of the skin and the tissue beneath it. The infected area is usually red and tender. Cellulitis occurs most often in the arms and lower legs.  °CAUSES  °Cellulitis is caused by bacteria that enter the skin through cracks or cuts in the skin. The most common types of bacteria that cause cellulitis are staphylococci and streptococci. °SIGNS AND SYMPTOMS  °· Redness and warmth. °· Swelling. °· Tenderness or pain. °· Fever. °DIAGNOSIS  °Your health care provider can usually determine what is wrong based on a physical exam. Blood tests may also be done. °TREATMENT  °Treatment usually involves taking an antibiotic medicine. °HOME CARE INSTRUCTIONS  °· Take your antibiotic medicine as directed by your health care provider. Finish the antibiotic even if you start to feel better. °· Keep the infected arm or leg elevated to reduce swelling. °· Apply a warm cloth to the affected area up to 4 times per day to relieve pain. °· Take medicines only as directed by your health care provider. °· Keep all follow-up visits as directed by your health care provider. °SEEK MEDICAL CARE IF:  °· You notice red streaks coming from the infected area. °· Your red area gets larger or turns dark in color. °· Your bone or joint underneath the infected area becomes painful after the skin has healed. °· Your infection returns in the same area or another area. °· You notice a swollen bump in the infected area. °· You develop new symptoms. °· You have a fever. °SEEK IMMEDIATE MEDICAL CARE IF:  °· You feel very sleepy. °· You develop vomiting or diarrhea. °· You have a general ill feeling (malaise) with muscle aches and pains. °MAKE SURE YOU:  °· Understand these instructions. °· Will watch your condition. °· Will get help right away if you are not doing well or get worse. °Document Released: 09/04/2005 Document Revised: 04/11/2014 Document Reviewed: 02/10/2012 °[employer]® Patient Information ©2015 [employer].  This information is not intended to replace advice given to you by your health care provider. Make sure you discuss any questions you have with your health care provider. ° °Abscess °An abscess is an infected area that contains a collection of pus and debris. It can occur in almost any part of the body. An abscess is also known as a furuncle or boil. °CAUSES  °An abscess occurs when tissue gets infected. This can occur from blockage of oil or sweat glands, infection of hair follicles, or a minor injury to the skin. As the body tries to fight the infection, pus collects in the area and creates pressure under the skin. This pressure causes pain. People with weakened immune systems have difficulty fighting infections and get certain abscesses more often.  °SYMPTOMS °Usually an abscess develops on the skin and becomes a painful mass that is red, warm, and tender. If the abscess forms under the skin, you may feel a moveable soft area under the skin. Some abscesses break open (rupture) on their own, but most will continue to get worse without care. The infection can spread deeper into the body and eventually into the bloodstream, causing you to feel ill.  °DIAGNOSIS  °Your caregiver will take your medical history and perform a physical exam. A sample of fluid may also be taken from the abscess to determine what is causing your infection. °TREATMENT  °Your caregiver may prescribe antibiotic medicines to fight the infection. However, taking antibiotics alone usually does not cure an abscess. Your caregiver may need to   make a small cut (incision) in the abscess to drain the pus. In some cases, gauze is packed into the abscess to reduce pain and to continue draining the area. °HOME CARE INSTRUCTIONS  °· Only take over-the-counter or prescription medicines for pain, discomfort, or fever as directed by your caregiver. °· If you were prescribed antibiotics, take them as directed. Finish them even if you start to feel  better. °· If gauze is used, follow your caregiver's directions for changing the gauze. °· To avoid spreading the infection: °¨ Keep your draining abscess covered with a bandage. °¨ Wash your hands well. °¨ Do not share personal care items, towels, or whirlpools with others. °¨ Avoid skin contact with others. °· Keep your skin and clothes clean around the abscess. °· Keep all follow-up appointments as directed by your caregiver. °SEEK MEDICAL CARE IF:  °· You have increased pain, swelling, redness, fluid drainage, or bleeding. °· You have muscle aches, chills, or a general ill feeling. °· You have a fever. °MAKE SURE YOU:  °· Understand these instructions. °· Will watch your condition. °· Will get help right away if you are not doing well or get worse. °Document Released: 09/04/2005 Document Revised: 05/26/2012 Document Reviewed: 02/07/2012 °[employer]® Patient Information ©2015 [employer]. This information is not intended to replace advice given to you by your health care provider. Make sure you discuss any questions you have with your health care provider. ° °

## 2015-06-28 NOTE — ED Notes (Signed)
Pt. reports skin abscess at right scrotum with drainage onset yesterday. Denies fever or chills.

## 2015-06-28 NOTE — ED Provider Notes (Signed)
CSN: [id number]     Arrival date & time 06/28/15  2003 History   First MD Initiated Contact with Patient 06/28/15 2017     Chief Complaint  Patient presents with  . Abscess    (Consider location/radiation/quality/duration/timing/severity/associated sxs/prior Treatment) Patient is a 34 y.o. male presenting with abscess. The history is provided by the patient.  Abscess Location:  Ano-genital Ano-genital abscess location:  Scrotum Size:  5 mm Abscess quality: draining, induration, painful, redness and warmth   Red streaking: yes   Pain details:    Quality:  Dull   Severity:  Moderate   Timing:  Constant   Progression:  Worsening Chronicity:  New Context: not diabetes, not injected drug use and not insect bite/sting   Relieved by:  Nothing Worsened by:  Nothing tried Ineffective treatments:  None tried Associated symptoms: no fatigue, no fever, no headaches, no nausea and no vomiting     Past Medical History  Diagnosis Date  . Anxiety   . Substance abuse   . Heart palpitations   . Hypercholesteremia    History reviewed. No pertinent past surgical history. Family History  Problem Relation Age of Onset  . Anxiety disorder Mother   . Stroke Paternal Grandmother   . Cancer Paternal Grandmother    History  Substance Use Topics  . Smoking status: Current Every Day Smoker -- 1.00 packs/day for 16 years  . Smokeless tobacco: Not on file  . Alcohol Use: No    Review of Systems  Constitutional: Negative for fever and fatigue.  Respiratory: Negative for chest tightness and shortness of breath.   Cardiovascular: Negative for chest pain.  Gastrointestinal: Negative for nausea, vomiting and abdominal pain.  Genitourinary: Positive for genital sores. Negative for dysuria, frequency, hematuria, flank pain, discharge, penile swelling, scrotal swelling and penile pain.  Musculoskeletal: Negative for back pain.  Neurological: Negative for light-headedness and headaches.   Psychiatric/Behavioral: Negative for confusion.  All other systems reviewed and are negative.     Allergies  Bee venom  Home Medications   Prior to Admission medications   Medication Sig Start Date End Date Taking? Authorizing Provider  famotidine (PEPCID) 40 MG tablet Take 1 tablet (40 mg total) by mouth daily. 03/17/15   Marisa Severin, MD  methadone (DOLOPHINE) 10 MG/ML solution Take 95 mg by mouth daily.    Historical Provider, MD  ondansetron (ZOFRAN-ODT) 8 MG disintegrating tablet Take 1 tablet (8 mg total) by mouth every 8 (eight) hours as needed for nausea or vomiting. 03/17/15   Marisa Severin, MD   BP 142/96 mmHg  Pulse 120  Temp(Src) 98.1 F (36.7 C) (Oral)  Resp 20  SpO2 97% Physical Exam  Constitutional: He is oriented to person, place, and time. He appears well-developed and well-nourished. No distress.  HENT:  Head: Normocephalic and atraumatic.  Nose: Nose normal.  Mouth/Throat: Oropharynx is clear and moist. No oropharyngeal exudate.  Eyes: EOM are normal. Pupils are equal, round, and reactive to light.  Neck: Normal range of motion. Neck supple.  Cardiovascular: Regular rhythm, normal heart sounds and intact distal pulses.  Tachycardia present.   No murmur heard. Pulmonary/Chest: Effort normal and breath sounds normal. No respiratory distress. He has no wheezes. He exhibits no tenderness.  Abdominal: Soft. He exhibits no distension. There is no tenderness. There is no guarding.  Genitourinary:  5 mm draining abscess at his lateral right scrotum with surrounding 2 cm of induration and ~4 cm of erythema.  Tender to touch.  Able to  express additional purulence from the wound then no more fluctuance palpable.  He has induration over the epididymis area of his right testicle, but is not tender to manipulation of the remainder of his teste or groin.  No other lesions, no penile discharge.    Musculoskeletal: Normal range of motion. He exhibits no tenderness.  Neurological: He  is alert and oriented to person, place, and time. No cranial nerve deficit. Coordination normal.  Skin: Skin is warm and dry. He is not diaphoretic. No pallor.  Psychiatric: His behavior is normal. Judgment and thought content normal. His mood appears anxious.  Nursing note and vitals reviewed.   ED Course  Procedures (including critical care time) Labs Review Labs Reviewed  URINE CULTURE  URINALYSIS, ROUTINE W REFLEX MICROSCOPIC (NOT AT [facility])  GC/CHLAMYDIA PROBE AMP ([place]) NOT AT [facility]    Imaging Review No results found.   EKG Interpretation None      MDM   Final diagnoses:  Scrotal abscess   Pt is a 34 yo M with hx of polysubstance abuse who presents with right scrotal asbcess.  Reports pain at right scrotum for 2 days with increasing erythema.  Noticed discharge from the lesion today, with a moderate amount of bloody/purulent material in his underwear.  Pain continued, which prompted his visit.  He denies fevers, chills, myalgias, N/V.  Still with normal urination, no dysuria.  No known hx of STDs or UTIs.  Is sexually active and denies condom use.  Has not had sex since noticing his lesion.  Pt is uncomfortable appearing but NAD.  Afebrile.  Mildly tachycardic, but anxious.  Has a 5 mm draining abscess at his lateral right scrotum with surrounding 2 cm of induration and ~4 cm of erythema.  Tender to touch.  Able to express additional purulence from the wound.  No more fluctuance palpable.  He has induration over the epididymis area of his right testicle, but is not tender to manipulation of the remainder of his teste or groin.  No other lesions, no penile discharge.    Will cover with clindamycin, given one dose here.   Given pain control and tachycardia improved.  Doubt other cause.    Urine with no signs of infection.    Spoke to Dr. Sherron MondayMacDiarmid with urology.  Will see patient in clinic before the weekend.  Given Rx for clindamycin and advised close follow up with  urology.  Given phone number to the urology clinic and stressed the importance of f/u.  Given short course of Rx for roxicodone and advised that he should use tylenol and motrin for pain q6 hrs for mild/moderate pain, then roxi for severe.  Discussed ED return precautions and he was discharged in stable condition.    Labs were reviewed and interpreted by myself and my attending, and incorporated in the medical decision making.  Patient was seen with ED Attending, Dr. Ginette PitmanWalden  Siana Panameno, MD    Lenell AntuJamie Seven Dollens, MD 06/29/15 1302  Elwin MochaBlair Walden, MD 06/29/15 [id number]

## 2015-06-28 NOTE — ED Notes (Signed)
Pt states abccess started yesterday, clear drainage noted, redness and swelling around site.

## 2015-06-29 LAB — GC/CHLAMYDIA PROBE AMP (~~LOC~~) NOT AT ARMC
CHLAMYDIA, DNA PROBE: NEGATIVE
NEISSERIA GONORRHEA: NEGATIVE

## 2015-06-30 LAB — URINE CULTURE: Culture: NO GROWTH

## 2015-09-23 ENCOUNTER — Emergency Department (HOSPITAL_COMMUNITY): Payer: Self-pay

## 2015-09-23 ENCOUNTER — Emergency Department (HOSPITAL_COMMUNITY)
Admission: EM | Admit: 2015-09-23 | Discharge: 2015-09-23 | Disposition: A | Discharge status: Home / Self Care | Payer: Self-pay | Attending: Emergency Medicine | Admitting: Emergency Medicine

## 2015-09-23 ENCOUNTER — Encounter (HOSPITAL_COMMUNITY): Payer: Self-pay | Admitting: Emergency Medicine

## 2015-09-23 DIAGNOSIS — W11XXXA Fall on and from ladder, initial encounter: Secondary | ICD-10-CM | POA: Insufficient documentation

## 2015-09-23 DIAGNOSIS — Y998 Other external cause status: Secondary | ICD-10-CM | POA: Insufficient documentation

## 2015-09-23 DIAGNOSIS — Z8659 Personal history of other mental and behavioral disorders: Secondary | ICD-10-CM | POA: Insufficient documentation

## 2015-09-23 DIAGNOSIS — S93402A Sprain of unspecified ligament of left ankle, initial encounter: Secondary | ICD-10-CM | POA: Insufficient documentation

## 2015-09-23 DIAGNOSIS — Y9289 Other specified places as the place of occurrence of the external cause: Secondary | ICD-10-CM | POA: Insufficient documentation

## 2015-09-23 DIAGNOSIS — Z8639 Personal history of other endocrine, nutritional and metabolic disease: Secondary | ICD-10-CM | POA: Insufficient documentation

## 2015-09-23 DIAGNOSIS — Z720 Tobacco use: Secondary | ICD-10-CM | POA: Insufficient documentation

## 2015-09-23 DIAGNOSIS — Y9339 Activity, other involving climbing, rappelling and jumping off: Secondary | ICD-10-CM | POA: Insufficient documentation

## 2015-09-23 DIAGNOSIS — Z79899 Other long term (current) drug therapy: Secondary | ICD-10-CM | POA: Insufficient documentation

## 2015-09-23 MED ORDER — HYDROCODONE-ACETAMINOPHEN 5-325 MG PO TABS
1.0000 | ORAL_TABLET | Freq: Once | ORAL | Status: AC
  Administered 2015-09-23: 1 via ORAL
  Filled 2015-09-23: qty 1

## 2015-09-23 MED ORDER — NAPROXEN 375 MG PO TABS
375.0000 mg | ORAL_TABLET | Freq: Two times a day (BID) | ORAL | Status: DC
Start: 2015-09-23 — End: 2016-01-03

## 2015-09-23 NOTE — ED Notes (Signed)
The pt twisted his lt ankle yesterday  Bruising noted lt lat ankle with sl swellling.  He reports  advil and tylenol not helping

## 2015-09-23 NOTE — ED Provider Notes (Signed)
CSN: [id number]     Arrival date & time 09/23/15  1859 History  By signing my name below, I, Lyndel Safe, attest that this documentation has been prepared under the direction and in the presence of Arthor Captain, PA-C. Electronically Signed: Lyndel Safe, ED Scribe. 09/23/2015. 7:40 PM.   Chief Complaint  Patient presents with  . Ankle Injury   The history is provided by the patient. No language interpreter was used.   HPI Comments: Frederick Young is a 34 y.o. male who presents to the Emergency Department complaining of sudden onset, constant lateral left ankle pain with swelling and ecchymosis to lateral left ankle and upper foot s/p twist injury that occurred 1 day ago. Pt reports he jumped down from a ladder and landed on his left foot in a hole and twisted his ankle. Pt was unable to bear weight on left ankle after the incident. He has applied ice with some relief of swelling. Denies past injury to left ankle.   Past Medical History  Diagnosis Date  . Anxiety   . Substance abuse   . Heart palpitations   . Hypercholesteremia    History reviewed. No pertinent past surgical history. Family History  Problem Relation Age of Onset  . Anxiety disorder Mother   . Stroke Paternal Grandmother   . Cancer Paternal Grandmother    Social History  Substance Use Topics  . Smoking status: Current Every Day Smoker -- 1.00 packs/day for 16 years  . Smokeless tobacco: None  . Alcohol Use: No    Review of Systems  Musculoskeletal: Positive for joint swelling ( left ankle) and arthralgias ( left ankle).  Skin: Positive for color change ( ecchymosis to left ankle ).   Allergies  Bee venom  Home Medications   Prior to Admission medications   Medication Sig Start Date End Date Taking? Authorizing Provider  acetaminophen (TYLENOL) 500 MG tablet Take 1,000 mg by mouth every 6 (six) hours as needed for moderate pain.    Historical Provider, MD  famotidine (PEPCID) 40 MG tablet  Take 1 tablet (40 mg total) by mouth daily. 03/17/15   Marisa Severin, MD  ondansetron (ZOFRAN-ODT) 8 MG disintegrating tablet Take 1 tablet (8 mg total) by mouth every 8 (eight) hours as needed for nausea or vomiting. 03/17/15   Marisa Severin, MD  oxyCODONE (ROXICODONE) 5 MG immediate release tablet Take 1 tablet (5 mg total) by mouth every 4 (four) hours as needed for severe pain. 06/28/15   Lenell Antu, MD   BP 140/88 mmHg  Pulse 105  Temp(Src) 99.2 F (37.3 C) (Oral)  Resp 18  SpO2 97% Physical Exam  Constitutional: He is oriented to person, place, and time. He appears well-developed and well-nourished. No distress.  HENT:  Head: Normocephalic.  Eyes: Conjunctivae are normal.  Neck: Normal range of motion. Neck supple.  Cardiovascular: Normal rate.   Pulmonary/Chest: Effort normal. No respiratory distress.  Musculoskeletal: Normal range of motion. He exhibits edema and tenderness.  Left ankle; significant bruising and swelling over lateral malleolus extending into the mid dorsum of left foot, TTP over lateral malleolus only, able to flex and extend, difficulty with inversion and eversion, NVI.  Neurological: He is alert and oriented to person, place, and time. Coordination normal.  Skin: Skin is warm.  Psychiatric: He has a normal mood and affect. His behavior is normal.  Nursing note and vitals reviewed.   ED Course  Procedures  DIAGNOSTIC STUDIES: Oxygen Saturation is 97% on RA, normal  by my interpretation.    COORDINATION OF CARE: 7:38 PM Discussed treatment plan with pt at bedside and pt agreed to plan.  Imaging Review Dg Ankle Complete Left  09/23/2015  CLINICAL DATA:  Lateral left ankle pain for 2 days status post fall EXAM: LEFT ANKLE COMPLETE - 3+ VIEW COMPARISON:  None. FINDINGS: There is no evidence of fracture, dislocation, or joint effusion. There is no evidence of arthropathy or other focal bone abnormality. There is severe soft tissue swelling over the lateral malleolus.  IMPRESSION: No acute osseous injury of the left ankle. Electronically Signed   By: Elige KoHetal  Patel   On: 09/23/2015 20:22   I have personally reviewed and evaluated these images results as part of my medical decision-making.   MDM   Final diagnoses:  Ankle sprain, left, initial encounter    Patient X-Ray negative for obvious fracture or dislocation. Pain managed in ED. Pt advised to follow up with orthopedics if symptoms persist for possibility of missed fracture diagnosis. Patient given brace while in ED, conservative therapy recommended and discussed. Patient will be dc home & is agreeable with above plan.   Lucila MaineI, Deondrea Markos, personally performed the services described in this documentation. All medical record entries made by the scribe were at my direction and in my presence.  I have reviewed the chart and discharge instructions and agree that the record reflects my personal performance and is accurate and complete. Arthor CaptainHarris, Swayzie Choate.  09/23/2015. 8:58 PM.       Arthor CaptainAbigail Undine Nealis, PA-C 09/23/15 [id number]  Arby BarretteMarcy Pfeiffer, MD 09/24/15 [phone]

## 2015-09-23 NOTE — ED Notes (Signed)
Patient here with left ankle injury. States yesterday he jumped down from a ladder and landed awkwardly on his ankle. States he skipped the last couple steps. Upon landing realized there was a hole that he had not noticed which caused the awkward landing. Left ankle appears diffusely swollen. Ice was applied to injury yesterday and patient reports color is better now than yesterday, but pain is severe.

## 2015-09-23 NOTE — Discharge Instructions (Signed)
Ankle Sprain °An ankle sprain is an injury to the strong, fibrous tissues (ligaments) that hold the bones of your ankle joint together.  °CAUSES °An ankle sprain is usually caused by a fall or by twisting your ankle. Ankle sprains most commonly occur when you step on the outer edge of your foot, and your ankle turns inward. People who participate in sports are more prone to these types of injuries.  °SYMPTOMS  °· Pain in your ankle. The pain may be present at rest or only when you are trying to stand or walk. °· Swelling. °· Bruising. Bruising may develop immediately or within 1 to 2 days after your injury. °· Difficulty standing or walking, particularly when turning corners or changing directions. °DIAGNOSIS  °Your caregiver will ask you details about your injury and perform a physical exam of your ankle to determine if you have an ankle sprain. During the physical exam, your caregiver will press on and apply pressure to specific areas of your foot and ankle. Your caregiver will try to move your ankle in certain ways. An X-ray exam may be done to be sure a bone was not broken or a ligament did not separate from one of the bones in your ankle (avulsion fracture).  °TREATMENT  °Certain types of braces can help stabilize your ankle. Your caregiver can make a recommendation for this. Your caregiver may recommend the use of medicine for pain. If your sprain is severe, your caregiver may refer you to a surgeon who helps to restore function to parts of your skeletal system (orthopedist) or a physical therapist. °HOME CARE INSTRUCTIONS  °· Apply ice to your injury for 1-2 days or as directed by your caregiver. Applying ice helps to reduce inflammation and pain. °· Put ice in a plastic bag. °· Place a towel between your skin and the bag. °· Leave the ice on for 15-20 minutes at a time, every 2 hours while you are awake. °· Only take over-the-counter or prescription medicines for pain, discomfort, or fever as directed by  your caregiver. °· Elevate your injured ankle above the level of your heart as much as possible for 2-3 days. °· If your caregiver recommends crutches, use them as instructed. Gradually put weight on the affected ankle. Continue to use crutches or a cane until you can walk without feeling pain in your ankle. °· If you have a plaster splint, wear the splint as directed by your caregiver. Do not rest it on anything harder than a pillow for the first 24 hours. Do not put weight on it. Do not get it wet. You may take it off to take a shower or bath. °· You may have been given an elastic bandage to wear around your ankle to provide support. If the elastic bandage is too tight (you have numbness or tingling in your foot or your foot becomes cold and blue), adjust the bandage to make it comfortable. °· If you have an air splint, you may blow more air into it or let air out to make it more comfortable. You may take your splint off at night and before taking a shower or bath. Wiggle your toes in the splint several times per day to decrease swelling. °SEEK MEDICAL CARE IF:  °· You have rapidly increasing bruising or swelling. °· Your toes feel extremely cold or you lose feeling in your foot. °· Your pain is not relieved with medicine. °SEEK IMMEDIATE MEDICAL CARE IF: °· Your toes are numb or blue. °·   You have severe pain that is increasing. °MAKE SURE YOU:  °· Understand these instructions. °· Will watch your condition. °· Will get help right away if you are not doing well or get worse. °  °This information is not intended to replace advice given to you by your health care provider. Make sure you discuss any questions you have with your health care provider. °  °Document Released: 11/25/2005 Document Revised: 12/16/2014 Document Reviewed: 12/07/2011 °Elsevier Interactive Patient Education ©2016 Elsevier Inc. ° °Cryotherapy °Cryotherapy means treatment with cold. Ice or gel packs can be used to reduce both pain and swelling.  Ice is the most helpful within the first 24 to 48 hours after an injury or flare-up from overusing a muscle or joint. Sprains, strains, spasms, burning pain, shooting pain, and aches can all be eased with ice. Ice can also be used when recovering from surgery. Ice is effective, has very few side effects, and is safe for most people to use. °PRECAUTIONS  °Ice is not a safe treatment option for people with: °· Raynaud phenomenon. This is a condition affecting small blood vessels in the extremities. Exposure to cold may cause your problems to return. °· Cold hypersensitivity. There are many forms of cold hypersensitivity, including: °¨ Cold urticaria. Red, itchy hives appear on the skin when the tissues begin to warm after being iced. °¨ Cold erythema. This is a red, itchy rash caused by exposure to cold. °¨ Cold hemoglobinuria. Red blood cells break down when the tissues begin to warm after being iced. The hemoglobin that carry oxygen are passed into the urine because they cannot combine with blood proteins fast enough. °· Numbness or altered sensitivity in the area being iced. °If you have any of the following conditions, do not use ice until you have discussed cryotherapy with your caregiver: °· Heart conditions, such as arrhythmia, angina, or chronic heart disease. °· High blood pressure. °· Healing wounds or open skin in the area being iced. °· Current infections. °· Rheumatoid arthritis. °· Poor circulation. °· Diabetes. °Ice slows the blood flow in the region it is applied. This is beneficial when trying to stop inflamed tissues from spreading irritating chemicals to surrounding tissues. However, if you expose your skin to cold temperatures for too long or without the proper protection, you can damage your skin or nerves. Watch for signs of skin damage due to cold. °HOME CARE INSTRUCTIONS °Follow these tips to use ice and cold packs safely. °· Place a dry or damp towel between the ice and skin. A damp towel will  cool the skin more quickly, so you may need to shorten the time that the ice is used. °· For a more rapid response, add gentle compression to the ice. °· Ice for no more than 10 to 20 minutes at a time. The bonier the area you are icing, the less time it will take to get the benefits of ice. °· Check your skin after 5 minutes to make sure there are no signs of a poor response to cold or skin damage. °· Rest 20 minutes or more between uses. °· Once your skin is numb, you can end your treatment. You can test numbness by very lightly touching your skin. The touch should be so light that you do not see the skin dimple from the pressure of your fingertip. When using ice, most people will feel these normal sensations in this order: cold, burning, aching, and numbness. °· Do not use ice on someone who   cannot communicate their responses to pain, such as small children or people with dementia. °HOW TO MAKE AN ICE PACK °Ice packs are the most common way to use ice therapy. Other methods include ice massage, ice baths, and cryosprays. Muscle creams that cause a cold, tingly feeling do not offer the same benefits that ice offers and should not be used as a substitute unless recommended by your caregiver. °To make an ice pack, do one of the following: °· Place crushed ice or a bag of frozen vegetables in a sealable plastic bag. Squeeze out the excess air. Place this bag inside another plastic bag. Slide the bag into a pillowcase or place a damp towel between your skin and the bag. °· Mix 3 parts water with 1 part rubbing alcohol. Freeze the mixture in a sealable plastic bag. When you remove the mixture from the freezer, it will be slushy. Squeeze out the excess air. Place this bag inside another plastic bag. Slide the bag into a pillowcase or place a damp towel between your skin and the bag. °SEEK MEDICAL CARE IF: °· You develop white spots on your skin. This may give the skin a blotchy (mottled) appearance. °· Your skin turns  blue or pale. °· Your skin becomes waxy or hard. °· Your swelling gets worse. °MAKE SURE YOU:  °· Understand these instructions. °· Will watch your condition. °· Will get help right away if you are not doing well or get worse. °  °This information is not intended to replace advice given to you by your health care provider. Make sure you discuss any questions you have with your health care provider. °  °Document Released: 07/22/2011 Document Revised: 12/16/2014 Document Reviewed: 07/22/2011 °Elsevier Interactive Patient Education ©2016 Elsevier Inc. ° °

## 2016-01-03 ENCOUNTER — Emergency Department (HOSPITAL_COMMUNITY)
Admission: EM | Admit: 2016-01-03 | Discharge: 2016-01-03 | Disposition: A | Discharge status: Home / Self Care | Payer: Self-pay | Attending: Emergency Medicine | Admitting: Emergency Medicine

## 2016-01-03 ENCOUNTER — Encounter (HOSPITAL_COMMUNITY): Payer: Self-pay | Admitting: Adult Health

## 2016-01-03 ENCOUNTER — Emergency Department (HOSPITAL_COMMUNITY): Payer: Self-pay

## 2016-01-03 DIAGNOSIS — Z8639 Personal history of other endocrine, nutritional and metabolic disease: Secondary | ICD-10-CM | POA: Insufficient documentation

## 2016-01-03 DIAGNOSIS — F1210 Cannabis abuse, uncomplicated: Secondary | ICD-10-CM | POA: Insufficient documentation

## 2016-01-03 DIAGNOSIS — F17200 Nicotine dependence, unspecified, uncomplicated: Secondary | ICD-10-CM | POA: Insufficient documentation

## 2016-01-03 DIAGNOSIS — R0602 Shortness of breath: Secondary | ICD-10-CM | POA: Insufficient documentation

## 2016-01-03 DIAGNOSIS — R61 Generalized hyperhidrosis: Secondary | ICD-10-CM | POA: Insufficient documentation

## 2016-01-03 DIAGNOSIS — R079 Chest pain, unspecified: Secondary | ICD-10-CM | POA: Insufficient documentation

## 2016-01-03 DIAGNOSIS — Z79891 Long term (current) use of opiate analgesic: Secondary | ICD-10-CM | POA: Insufficient documentation

## 2016-01-03 DIAGNOSIS — F1110 Opioid abuse, uncomplicated: Secondary | ICD-10-CM | POA: Insufficient documentation

## 2016-01-03 DIAGNOSIS — R000 Tachycardia, unspecified: Secondary | ICD-10-CM | POA: Insufficient documentation

## 2016-01-03 DIAGNOSIS — Z8659 Personal history of other mental and behavioral disorders: Secondary | ICD-10-CM | POA: Insufficient documentation

## 2016-01-03 LAB — CBC
HCT: 47.2 % (ref 39.0–52.0)
Hemoglobin: 16.1 g/dL (ref 13.0–17.0)
MCH: 34.1 pg — ABNORMAL HIGH (ref 26.0–34.0)
MCHC: 34.1 g/dL (ref 30.0–36.0)
MCV: 100 fL (ref 78.0–100.0)
PLATELETS: 278 10*3/uL (ref 150–400)
RBC: 4.72 MIL/uL (ref 4.22–5.81)
RDW: 13.9 % (ref 11.5–15.5)
WBC: 16.6 10*3/uL — AB (ref 4.0–10.5)

## 2016-01-03 LAB — RAPID URINE DRUG SCREEN, HOSP PERFORMED
Amphetamines: NOT DETECTED
BARBITURATES: NOT DETECTED
Benzodiazepines: NOT DETECTED
Cocaine: NOT DETECTED
Opiates: POSITIVE — AB
Tetrahydrocannabinol: POSITIVE — AB

## 2016-01-03 LAB — BASIC METABOLIC PANEL
Anion gap: 17 — ABNORMAL HIGH (ref 5–15)
BUN: 6 mg/dL (ref 6–20)
CALCIUM: 9.3 mg/dL (ref 8.9–10.3)
CO2: 24 mmol/L (ref 22–32)
CREATININE: 0.89 mg/dL (ref 0.61–1.24)
Chloride: 92 mmol/L — ABNORMAL LOW (ref 101–111)
GFR calc Af Amer: >60 mL/min (ref >60)
Glucose, Bld: 145 mg/dL — ABNORMAL HIGH (ref 65–99)
POTASSIUM: 3.7 mmol/L (ref 3.5–5.1)
SODIUM: 133 mmol/L — AB (ref 135–145)

## 2016-01-03 LAB — I-STAT TROPONIN, ED
TROPONIN I, POC: 0 ng/mL (ref 0.00–0.08)
TROPONIN I, POC: 0 ng/mL (ref 0.00–0.08)

## 2016-01-03 LAB — D-DIMER, QUANTITATIVE (NOT AT ARMC): D DIMER QUANT: 0.81 ug{FEU}/mL — AB (ref 0.00–0.50)

## 2016-01-03 LAB — ETHANOL: ALCOHOL ETHYL (B): 21 mg/dL — AB (ref <5)

## 2016-01-03 MED ORDER — SODIUM CHLORIDE 0.9 % IV BOLUS (SEPSIS)
2000.0000 mL | Freq: Once | INTRAVENOUS | Status: AC
  Administered 2016-01-03: 2000 mL via INTRAVENOUS

## 2016-01-03 MED ORDER — IOHEXOL 350 MG/ML SOLN
100.0000 mL | Freq: Once | INTRAVENOUS | Status: AC | PRN
Start: 2016-01-03 — End: 2016-01-03
  Administered 2016-01-03: 100 mL via INTRAVENOUS

## 2016-01-03 MED ORDER — SODIUM CHLORIDE 0.9 % IV BOLUS (SEPSIS)
1000.0000 mL | Freq: Once | INTRAVENOUS | Status: AC
  Administered 2016-01-03: 1000 mL via INTRAVENOUS

## 2016-01-03 NOTE — Discharge Instructions (Signed)
°Emergency Department Resource Guide °1) Find a Doctor and Pay Out of Pocket °Although you won't have to find out who is covered by your insurance plan, it is a good idea to ask around and get recommendations. You will then need to call the office and see if the doctor you have chosen will accept you as a new patient and what types of options they offer for patients who are self-pay. Some doctors offer discounts or will set up payment plans for their patients who do not have insurance, but you will need to ask so you aren't surprised when you get to your appointment. ° °2) Contact Your Local Health Department °Not all health departments have doctors that can see patients for sick visits, but many do, so it is worth a call to see if yours does. If you don't know where your local health department is, you can check in your phone book. The CDC also has a tool to help you locate your state's health department, and many state websites also have listings of all of their local health departments. ° °3) Find a Walk-in Clinic °If your illness is not likely to be very severe or complicated, you may want to try a walk in clinic. These are popping up all over the country in pharmacies, drugstores, and shopping centers. They're usually staffed by nurse practitioners or physician assistants that have been trained to treat common illnesses and complaints. They're usually fairly quick and inexpensive. However, if you have serious medical issues or chronic medical problems, these are probably not your best option. ° °No Primary Care Doctor: °- Call [facility] at  832-8000 - they can help you locate a primary care doctor that  accepts your insurance, provides certain services, etc. °- Physician Referral Service- 1-800-533-3463 ° °Chronic Pain Problems: °Organization         Address  Phone   Notes  °[facility]  ([phone]) [phone] Patients need to be referred by their primary care doctor.  ° °Medication  Assistance: °Organization         Address  Phone   Notes  °[place] [facility] [address] °[place], [place] [address] (336) 641-8030 --Must be a resident of [place] °-- Must have NO insurance coverage whatsoever (no Medicaid/ Medicare, etc.) °-- The pt. MUST have a primary care doctor that directs their care regularly and follows them in the community °  °[facility]  (866) 331-1348   °[employer]  (888) 892-1162   ° °Agencies that provide inexpensive medical care: °Organization         Address  Phone   Notes  °[facility]  (336) 832-8035   °[facility]    (336) 832-7272   °[facility] [address] °[place], [place] [address] (336) 832-4777   °[facility] [address], °[place] (336) 271-4999   °[facility]    (336) 373-0678   °[facility]    (336) 272-1050   °[facility] ° [address], [place] Phone:  (336) 832-4444, Fax:  (336) 832-4440 Hours of Operation:  9 am - 6 pm, M-F.  Also accepts Medicaid/Medicare and self-pay.  °[facility] ° [address], [place] Phone: (336) [phone], Fax: (336) [phone]. Hours of Operation:  8:30 am - 5:30 pm, M-F.  Also accepts Medicaid and self-pay.  °[facility] [place] [address], [place] Phone: (336) 878-6027   °[facility] [address], [place], [place] (336)723-1848, Ext. 123 Mondays & Thursdays: 7-9 AM.  First 15 patients are seen on a first come, first serve basis. °  ° °Medicaid-accepting [place] Providers: ° °Organization         Address  Phone   Notes  °[facility] [address], [place] (336) [phone] Also accepts self-pay patients.  °[facility] [address], [place] ° (336) [phone]   °[facility] [address], [place]  (336) 288-8857   °[facility] [address] [place] [address], [place] (336) 299-7000   °Veita Bland [address], [place]  ° (336) [phone] Only accepts [place] Access Medicaid patients after they have their name applied to their card.  ° °Self-Pay (no insurance) in [place]: ° °Organization         Address  Phone   Notes  °Sickle Cell Patients, [facility] [address], [place] (336) 832-1970   °[facility] [address], [place] (336) 832-4400   °[facility] ° [address], [place] (336) 992-4800   °[facility]/Dr. Osei-Bonsu ° [address] [place] [address], [place] or [address], [place] (336) [phone] Phone number for both [place] and [place] locations is the same.  °[facility] [address], [place] (336) 299-0000   °[facility] [address] [place] [address], [place] or [address] (336) 852-7530 °(336) 878-2260   °[facility] [address], [place] (336) 350-1642, phone; (336) 294-5005, fax Sees patients 1st and 3rd Saturday of every month.  Must not qualify for public or private insurance (i.e. Medicaid, Medicare, [place] Health Choice, Veterans' Benefits) • Household income should be no more than 200% of the poverty level •The clinic cannot treat you if you are pregnant or think you are pregnant • Sexually transmitted diseases are not treated at the clinic.  ° ° °Dental Care: °Organization         Address  Phone  Notes  °[place] [facility] [address], [place] (336) 641-6152 Accepts children up to age 21 who are enrolled in Medicaid or [place] Health Choice; pregnant women with a Medicaid card; and children who have applied for Medicaid or [place] Health Choice, but were declined, whose parents can pay a reduced fee at time of service.  °[place]  [facility] [place]  [address], [place] (336) 641-7733 Accepts children up to age 21 who are enrolled in Medicaid or [place] Health Choice; pregnant women with a Medicaid card; and children who have applied for Medicaid or [place] Health Choice, but were declined, whose parents can pay a reduced fee at time of service.  °[facility] ° [address], [place] (336) 641-4533 Patients are seen by appointment only. Walk-ins are not accepted. [facility] will see patients 18 years of age and older. °Monday - Tuesday (8am-5pm) °Most Wednesdays (8:30-5pm) °$30 per visit, cash only  °[facility] ° [address], [place] (336) [phone] Patients are seen by appointment only. Walk-ins are not accepted. [facility] will see patients 18 years of age and older. °One   Wednesday Evening (Monthly: Volunteer Based).  $30 per visit, cash only  °[facility]  (919) [phone] for adults; Children under age 4, call Graduate Pediatric Dentistry at ([phone]) [phone]. Children aged 4-14, please call ([phone]) [phone] to request a pediatric application. ° Dental services are provided in all areas of dental care including fillings, crowns and bridges, complete and partial dentures, implants, gum treatment, root canals, and extractions. Preventive care is also provided. Treatment is provided to both adults and children. °Patients are selected via a lottery and there is often a waiting list. °  °[facility] [address], °[place] ° (336) 763-8833 [email or web address] °  °[facility] [address], [place], [place] (336)723-1848, Ext. 123 Second and Fourth Thursday of each month, opens at 6:30 AM; Clinic ends at 9 AM.  Patients are seen on a first-come first-served basis, and a limited number are seen during each clinic.  ° °[facility] ° [address], [place], [place] (336) [phone]    Eligibility Requirements °You must have lived in [place], [place], or [place] counties for at least the last three months. °  You cannot be eligible for state or federal sponsored healthcare insurance, including Veterans Administration, Medicaid, or Medicare. °  You generally cannot be eligible for healthcare insurance through your employer.  °  How to apply: °Eligibility screenings are held every Tuesday and Wednesday afternoon from 1:00 pm until 4:00 pm. You do not need an appointment for the interview!  °[facility] [address], [place], [place] 336-631-2330   °[facility]  336-342-8273   °[facility]  336-703-3100   °[facility]  336-570-6415   ° °Behavioral Health Resources in the Community: °Intensive Outpatient Programs °Organization         Address  Phone  Notes  °[facility] [address], [place], [place] 336-878-6098   °[facility] [address], [place], [place] 336-832-9800   °[facility] [address], [place], [place] ° 336-882-2125   °[facility] [address],  °[place], [place] 1-800-853-5163 or 336-641-4981   °Substance Abuse Resources °Organization         Address  Phone  Notes  °[facility]  336-882-2125   °[facility]  336-784-9470   °[facility]  336-285-9073   °[facility]  336-845-3988   °Residential & Outpatient Substance Abuse Program  1-800-659-3381   °Psychological Services °Organization         Address  Phone  Notes  °[facility]  336- 832-9600   °[facility]  336- 378-7881   °[facility] [address], [place] 1-800-853-5163 or 336-641-4981   ° °Mobile Crisis Teams °Organization         Address  Phone  Notes  °[facility], Mobile Crisis Care Unit  1-877-626-1772   °Assertive °[facility] ° [address]  [place], [place] 336-834-9664   °Sharon DeEsch [address] °[place] 336-554-5454   ° °Self-Help/Support Groups °Organization         Address  Phone             Notes  °[facility] - variety of support groups  336- 373-1402 Call for more information  °Narcotics Anonymous (NA), [facility] [address], °[place]  2 meetings at this location  ° °  Residential Treatment Programs °Organization         Address  Phone  Notes  °[facility] [address],    °[place]  1-866-801-8205   °[facility] ° [address], [place], [place] 704-293-8524   °[facility] [address], [place] 336-845-3988 Admissions: 8am-3pm M-F  °[facility] [address]    °[place], [place] 336-841-1104   °[facility] [address], [place], [place] 336-379-7146   °[facility] [address]  °[place], [place] 336-285-9073   °[facility] - Intensive Outpatient [address], [place], [place] 336-852-3033   °[facility] [address]  °[place], [place] 1-877-615-2722 or 336-784-9470   °[facility] [address] [place], [place] 336-227-7417 Accepts Medicaid  °[facility] [address]  °[place] 1-800-659-3381 Substance Abuse/Addiction Treatment  ° °[place] Behavioral Health Resources °Organization         Address  Phone  Notes  °[facility]  (888) 581-9988   °Julie Brannon, PhD [address] [place], [place]   (336) 349-5553 or (336) 951-0000   °[facility]   [address] °[place], [place] (336) 349-4454   °[facility] [address], [place], [place] (336) 342-8316 Insurance/Medicaid/sponsorship through [facility]  °[facility] [address]                                    [place], [place] (336) 342-8316 Therapy/tele-psych/case    °[facility] [address]  ° [place], [place] (336) 349-2233    °Dr. Arfeen  (336) 349-4544   °[facility] [place]  [employer] [place] [facility] 1) [address],  °2) [address], [place] °3)  [address], [place] (336) 349-3220 °(336) 342-7768 ° °(336) 342-8140   °[place] [facility] (336) 342-1394 or (336) 342-3537 (After Hours)    ° ° °

## 2016-01-03 NOTE — ED Notes (Signed)
Dr. Criss Alvine

## 2016-01-03 NOTE — ED Notes (Addendum)
Pt states he usually drinks 6-8 small "boot leggers" a day and throughout the day has had 6-7  States it has been about 2-3 days since he used IV heroin. States he had missed the methadone clinic and was real sick. That was the first time he had used in a while.

## 2016-01-03 NOTE — ED Notes (Signed)
Presents with diaphoresis, chest pain on left side and into sternum, SOB and pale color began at midnight this evening. 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 Very diaphoretic and shaky. HR 128, BP 171/109.

## 2016-01-03 NOTE — ED Notes (Signed)
Patient transported to CT 

## 2016-01-03 NOTE — ED Provider Notes (Signed)
CSN: [id number]     Arrival date & time 01/03/16  0453 History   First MD Initiated Contact with Patient 01/03/16 0700     Chief Complaint  Patient presents with  . Chest Pain     (Consider location/radiation/quality/duration/timing/severity/associated sxs/prior Treatment) HPI 35 year old male presents with chest tightness that started around 2 or 3 AM. Patient states she's having a hard time going to sleep and he started having the tightness along with associated sweating and shortness of breath. Patient states he drank a couple beers but nothing more than typical. Most recently used IV heroin 2 days ago but nothing sooner than that. Denies cocaine use. The pain is mostly a tightness but there is also occasionally more of a sharp pain. No cough. He's not sure if he is having a fever this morning or not but he took ibuprofen. Denies any abdominal pain, nausea, or vomiting. No history of leg swelling or prior history of DVT. Pain has improved but is not gone.  Past Medical History  Diagnosis Date  . Anxiety   . Substance abuse   . Heart palpitations   . Hypercholesteremia    History reviewed. No pertinent past surgical history. Family History  Problem Relation Age of Onset  . Anxiety disorder Mother   . Stroke Paternal Grandmother   . Cancer Paternal Grandmother    Social History  Substance Use Topics  . Smoking status: Current Every Day Smoker -- 1.00 packs/day for 16 years  . Smokeless tobacco: None  . Alcohol Use: No    Review of Systems  Constitutional: Positive for diaphoresis. Negative for fever.  Respiratory: Positive for chest tightness and shortness of breath.   Cardiovascular: Positive for chest pain. Negative for leg swelling.  Gastrointestinal: Negative for vomiting.  All other systems reviewed and are negative.     Allergies  Bee venom  Home Medications   Prior to Admission medications   Medication Sig Start Date End Date Taking? Authorizing Provider   acetaminophen (TYLENOL) 500 MG tablet Take 1,000 mg by mouth every 6 (six) hours as needed for moderate pain.   Yes Historical Provider, MD  ibuprofen (ADVIL,MOTRIN) 200 MG tablet Take 600 mg by mouth every 6 (six) hours as needed for moderate pain.   Yes Historical Provider, MD  methadone (DOLOPHINE) 1 MG/1ML solution Take 130 mg by mouth daily.   Yes Historical Provider, MD   BP 119/80 mmHg  Pulse 97  Temp(Src) 98.2 F (36.8 C) (Oral)  Resp 15  SpO2 94% Physical Exam  Constitutional: He is oriented to person, place, and time. He appears well-developed and well-nourished.  HENT:  Head: Normocephalic and atraumatic.  Right Ear: External ear normal.  Left Ear: External ear normal.  Nose: Nose normal.  Eyes: Right eye exhibits no discharge. Left eye exhibits no discharge.  Neck: Neck supple.  Cardiovascular: Normal rate, regular rhythm, normal heart sounds and intact distal pulses.   No murmur heard. Pulmonary/Chest: Effort normal and breath sounds normal. He exhibits no tenderness.  Abdominal: Soft. He exhibits no distension. There is no tenderness.  Musculoskeletal: He exhibits no edema.  Neurological: He is alert and oriented to person, place, and time.  Skin: Skin is warm and dry. He is not diaphoretic.  Nursing note and vitals reviewed.   ED Course  Procedures (including critical care time) Labs Review Labs Reviewed  BASIC METABOLIC PANEL - Abnormal; Notable for the following:    Sodium 133 (*)    Chloride 92 (*)  Glucose, Bld 145 (*)    Anion gap 17 (*)    All other components within normal limits  CBC - Abnormal; Notable for the following:    WBC 16.6 (*)    MCH 34.1 (*)    All other components within normal limits  ETHANOL - Abnormal; Notable for the following:    Alcohol, Ethyl (B) 21 (*)    All other components within normal limits  URINE RAPID DRUG SCREEN, HOSP PERFORMED - Abnormal; Notable for the following:    Opiates POSITIVE (*)    Tetrahydrocannabinol  POSITIVE (*)    All other components within normal limits  I-STAT TROPOININ, ED    Imaging Review Dg Chest 2 View  01/03/2016  CLINICAL DATA:  Mid chest pain and tightness, shortness of breath, diaphoresis, all beginning last night, smoker EXAM: CHEST  2 VIEW COMPARISON:  06/07/2011 FINDINGS: Normal heart size, mediastinal contours, and pulmonary vascularity. Mild peribronchial thickening. No pulmonary infiltrate, pleural effusion or pneumothorax. Osseous structures unremarkable. IMPRESSION: Mild bronchitic changes without infiltrate. Electronically Signed   By: Ulyses Southward M.D.   On: 01/03/2016 07:40   Ct Angio Chest Pe W/cm &/or Wo Cm  01/03/2016  CLINICAL DATA:  Just an last night.  Minimal shortness of breath. EXAM: CT ANGIOGRAPHY CHEST WITH CONTRAST TECHNIQUE: Multidetector CT imaging of the chest was performed using the standard protocol during bolus administration of intravenous contrast. Multiplanar CT image reconstructions and MIPs were obtained to evaluate the vascular anatomy. CONTRAST:  OMNIPAQUE IOHEXOL 350 MG/ML SOLN COMPARISON:  None. FINDINGS: There is adequate opacification of the pulmonary arteries. There is no pulmonary embolus. The main pulmonary artery, right main pulmonary artery and left main pulmonary arteries are normal in size. The heart size is normal. There is no pericardial effusion. The thoracic aorta is normal in caliber. There is no thoracic aortic dissection. The lungs are clear. There is no focal consolidation, pleural effusion or pneumothorax. There is no axillary, hilar, or mediastinal adenopathy. There is retroareolar fibroglandular tissue consistent with gynecomastia. There is no lytic or blastic osseous lesion. There is low attenuation of the liver as can be seen with hepatic steatosis. Review of the MIP images confirms the above findings. IMPRESSION: 1. No evidence of pulmonary embolus. 2. Normal caliber thoracic aorta without dissection. 3. Hepatic steatosis.  4. Bilateral gynecomastia. Electronically Signed   By: Elige Ko   On: 01/03/2016 09:51   I have personally reviewed and evaluated these images and lab results as part of my medical decision-making.   EKG Interpretation   Date/Time:  Wednesday January 03 2016 04:54:05 EST Ventricular Rate:  124 PR Interval:  112 QRS Duration: 90 QT Interval:  352 QTC Calculation: 505 R Axis:   65 Text Interpretation:  Sinus tachycardia Otherwise normal ECG Prolonged QT  tachycardia now present Confirmed by Erroll Luna [id number]) on  01/03/2016 5:03:29 AM       EKG Interpretation  Date/Time:  Wednesday January 03 2016 07:03:29 EST Ventricular Rate:  87 PR Interval:  129 QRS Duration: 91 QT Interval:  369 QTC Calculation: 444 R Axis:   47 Text Interpretation:  Sinus rhythm no acute ST/T changes tachycardia resolved from earlier in the day Confirmed by Laretta Pyatt  MD, Slevin Gunby ([id number]) on 01/03/2016 7:14:03 AM       MDM   Final diagnoses:  Chest pain, unspecified chest pain type    Patient with atypical chest pain/tightness in middle of night. No risk factors, he states no cocaine use.  Given he was tachycardic prior to my evaluation a PE workup was obtained and was negative. Has 2 negative troponins. No significant ECG changes on ECG, especially when slowed down. No risk factors for ACS. No signs of infection. At this time will d/c and recommend PCP follow up. Discussed strict return precautions.     Pricilla Loveless, MD 01/03/16 [phone]

## 2016-02-28 ENCOUNTER — Emergency Department (HOSPITAL_COMMUNITY)
Admission: EM | Admit: 2016-02-28 | Discharge: 2016-02-29 | Disposition: A | Discharge status: Home / Self Care | Payer: Self-pay | Attending: Emergency Medicine | Admitting: Emergency Medicine

## 2016-02-28 ENCOUNTER — Encounter (HOSPITAL_COMMUNITY): Payer: Self-pay | Admitting: Family Medicine

## 2016-02-28 ENCOUNTER — Emergency Department (HOSPITAL_COMMUNITY): Payer: Self-pay

## 2016-02-28 DIAGNOSIS — R1012 Left upper quadrant pain: Secondary | ICD-10-CM | POA: Insufficient documentation

## 2016-02-28 DIAGNOSIS — Z8659 Personal history of other mental and behavioral disorders: Secondary | ICD-10-CM | POA: Insufficient documentation

## 2016-02-28 DIAGNOSIS — F17200 Nicotine dependence, unspecified, uncomplicated: Secondary | ICD-10-CM | POA: Insufficient documentation

## 2016-02-28 DIAGNOSIS — Z79891 Long term (current) use of opiate analgesic: Secondary | ICD-10-CM | POA: Insufficient documentation

## 2016-02-28 DIAGNOSIS — E876 Hypokalemia: Secondary | ICD-10-CM | POA: Insufficient documentation

## 2016-02-28 DIAGNOSIS — R0602 Shortness of breath: Secondary | ICD-10-CM | POA: Insufficient documentation

## 2016-02-28 DIAGNOSIS — R042 Hemoptysis: Secondary | ICD-10-CM | POA: Insufficient documentation

## 2016-02-28 DIAGNOSIS — Z7982 Long term (current) use of aspirin: Secondary | ICD-10-CM | POA: Insufficient documentation

## 2016-02-28 LAB — BASIC METABOLIC PANEL
Anion gap: 13 (ref 5–15)
CO2: 24 mmol/L (ref 22–32)
Calcium: 8.9 mg/dL (ref 8.9–10.3)
Chloride: 100 mmol/L — ABNORMAL LOW (ref 101–111)
Creatinine, Ser: 0.74 mg/dL (ref 0.61–1.24)
GFR calc Af Amer: >60 mL/min (ref >60)
GLUCOSE: 144 mg/dL — AB (ref 65–99)
POTASSIUM: 3 mmol/L — AB (ref 3.5–5.1)
Sodium: 137 mmol/L (ref 135–145)

## 2016-02-28 LAB — CBC
HEMATOCRIT: 44.9 % (ref 39.0–52.0)
HEMOGLOBIN: 14.7 g/dL (ref 13.0–17.0)
MCH: 32.2 pg (ref 26.0–34.0)
MCHC: 32.7 g/dL (ref 30.0–36.0)
MCV: 98.5 fL (ref 78.0–100.0)
Platelets: 205 10*3/uL (ref 150–400)
RBC: 4.56 MIL/uL (ref 4.22–5.81)
RDW: 12.9 % (ref 11.5–15.5)
WBC: 7.1 10*3/uL (ref 4.0–10.5)

## 2016-02-28 LAB — I-STAT TROPONIN, ED
TROPONIN I, POC: 0 ng/mL (ref 0.00–0.08)
Troponin i, poc: 0 ng/mL (ref 0.00–0.08)

## 2016-02-28 LAB — LIPASE, BLOOD: Lipase: 25 U/L (ref 11–51)

## 2016-02-28 NOTE — ED Provider Notes (Signed)
CSN: [id number]     Arrival date & time 02/28/16  1728 History   First MD Initiated Contact with Patient 02/28/16 2102     Chief Complaint  Patient presents with  . Chest Pain  . Shortness of Breath  . Hemoptysis   Patient is a 35 y.o. male presenting with chest pain and shortness of breath. The history is provided by the patient.  Chest Pain Pain location:  L chest Pain quality: aching and sharp   Pain radiates to the back: yes   Pain severity:  Moderate Duration:  2 months Timing: pretty much constant for months but it sometimes gets worse. Context: breathing   Relieved by:  Nothing Associated symptoms: cough and shortness of breath   Associated symptoms: no fever, no nausea and not vomiting   Shortness of Breath Associated symptoms: chest pain and cough   Associated symptoms: no fever and no vomiting   He is an alcoholic.  Occasionally he has been spitting up blood.  Not sure if it is coughing or spitting but does not have hematemesis.  Came in this evening because of persistent symptoms  Past Medical History  Diagnosis Date  . Anxiety   . Substance abuse   . Heart palpitations   . Hypercholesteremia    History reviewed. No pertinent past surgical history. Family History  Problem Relation Age of Onset  . Anxiety disorder Mother   . Stroke Paternal Grandmother   . Cancer Paternal Grandmother    Social History  Substance Use Topics  . Smoking status: Current Every Day Smoker -- 1.00 packs/day for 16 years  . Smokeless tobacco: None  . Alcohol Use: Yes     Comment: pint of liquor  quit smoking a couple of months ago, he drinks alcohol daily.  Denies using any drugs  Review of Systems  Constitutional: Negative for fever.  Respiratory: Positive for cough and shortness of breath.   Cardiovascular: Positive for chest pain.  Gastrointestinal: Negative for nausea and vomiting.      Allergies  Bee venom  Home Medications   Prior to Admission medications    Medication Sig Start Date End Date Taking? Authorizing Provider  aspirin 325 MG EC tablet Take 325 mg by mouth every 6 (six) hours as needed for pain.   Yes Historical Provider, MD  methadone (DOLOPHINE) 1 MG/1ML solution Take 130 mg by mouth daily.   Yes Historical Provider, MD  pantoprazole (PROTONIX) 20 MG tablet Take 2 tablets (40 mg total) by mouth daily. 02/29/16   Linwood DibblesJon Curtiss Mahmood, MD   BP 148/95 mmHg  Pulse 96  Temp(Src) 98.2 F (36.8 C) (Oral)  Resp 14  Ht 5\' 10"  (1.778 m)  Wt 108.495 kg  BMI 34.32 kg/m2  SpO2 95% Physical Exam  Constitutional: He appears well-developed and well-nourished. No distress.  HENT:  Head: Normocephalic and atraumatic.  Right Ear: External ear normal.  Left Ear: External ear normal.  Eyes: Conjunctivae are normal. Right eye exhibits no discharge. Left eye exhibits no discharge. No scleral icterus.  Neck: Neck supple. No tracheal deviation present.  Cardiovascular: Normal rate, regular rhythm and intact distal pulses.   Pulmonary/Chest: Effort normal and breath sounds normal. No stridor. No respiratory distress. He has no wheezes. He has no rales.  Abdominal: Soft. Bowel sounds are normal. He exhibits no distension. There is tenderness in the left upper quadrant. There is no rebound and no guarding.  Mild ttp  Musculoskeletal: He exhibits no edema or tenderness.  Neurological: He  is alert. He has normal strength. No cranial nerve deficit (no facial droop, extraocular movements intact, no slurred speech) or sensory deficit. He exhibits normal muscle tone. He displays no seizure activity. Coordination normal.  Skin: Skin is warm and dry. No rash noted.  Psychiatric: He has a normal mood and affect.  Nursing note and vitals reviewed.   ED Course  Procedures (including critical care time) Labs Review Labs Reviewed  BASIC METABOLIC PANEL - Abnormal; Notable for the following:    Potassium 3.0 (*)    Chloride 100 (*)    Glucose, Bld 144 (*)    BUN <5  (*)    All other components within normal limits  CBC  LIPASE, BLOOD  D-DIMER, QUANTITATIVE (NOT AT [facility])  Rosezena Sensor, ED  Rosezena Sensor, ED    Imaging Review Dg Chest 2 View  02/28/2016  CLINICAL DATA:  Short of breath. Headache. Left chest pain. Left flank pain. Symptoms for 1 month. Prior smoker. EXAM: CHEST  2 VIEW COMPARISON:  01/03/2016 FINDINGS: Normal heart size. Clear lungs. No pneumothorax. No pleural effusion. IMPRESSION: No active cardiopulmonary disease. Electronically Signed   By: Jolaine Click M.D.   On: 02/28/2016 18:19   I have personally reviewed and evaluated these images and lab results as part of my medical decision-making.   EKG Interpretation   Date/Time:  Wednesday February 28 2016 17:31:20 EDT Ventricular Rate:  115 PR Interval:  122 QRS Duration: 92 QT Interval:  334 QTC Calculation: 462 R Axis:   71 Text Interpretation:  Sinus tachycardia Nonspecific T wave abnormality  Abnormal ECG Since last tracing rate faster Confirmed by Demarie Hyneman  MD-J, Kasidy Gianino  ([id number]) on 02/28/2016 9:06:57 PM     Medications  potassium chloride SA (K-DUR,KLOR-CON) CR tablet 40 mEq (not administered)    MDM   Final diagnoses:  Left upper quadrant pain  Hypokalemia    Abd exam is benign.  Lab tests are reassuring (except potassium).  No anemia.  CXR normal.  Doubt cardiac etiology.  Doubt PE with normal d dimer.  Counseled on alcohol use.  Rx antacids for possible gastritis.  Follow up with [place] [facility].    Linwood Dibbles, MD 02/29/16 [phone]

## 2016-02-28 NOTE — ED Notes (Addendum)
Pt here for left sided chest pain radiating into back and LUQ x 1 month. sts coughing up blood x 2 weeks. sts 3 days of HA and nausea. sts 100 pound weight gain in a year. sts random vomiting with trying to quit alcohol.

## 2016-02-29 LAB — D-DIMER, QUANTITATIVE (NOT AT ARMC)

## 2016-02-29 MED ORDER — POTASSIUM CHLORIDE CRYS ER 20 MEQ PO TBCR
40.0000 meq | EXTENDED_RELEASE_TABLET | Freq: Once | ORAL | Status: AC
  Administered 2016-02-29: 40 meq via ORAL
  Filled 2016-02-29: qty 2

## 2016-02-29 MED ORDER — PANTOPRAZOLE SODIUM 20 MG PO TBEC: 40.0000 mg | DELAYED_RELEASE_TABLET | Freq: Every day | ORAL | Status: DC

## 2016-02-29 NOTE — Discharge Instructions (Signed)

## 2016-03-13 ENCOUNTER — Ambulatory Visit (INDEPENDENT_AMBULATORY_CARE_PROVIDER_SITE_OTHER): Payer: No Typology Code available for payment source | Admitting: Family Medicine

## 2016-03-13 ENCOUNTER — Other Ambulatory Visit: Payer: Self-pay | Admitting: Family Medicine

## 2016-03-13 ENCOUNTER — Encounter: Payer: Self-pay | Admitting: Family Medicine

## 2016-03-13 VITALS — BP 136/92 | HR 97 | Temp 98.1°F | Resp 16 | Ht 70.0 in | Wt 242.0 lb

## 2016-03-13 DIAGNOSIS — F1029 Alcohol dependence with unspecified alcohol-induced disorder: Secondary | ICD-10-CM

## 2016-03-13 DIAGNOSIS — Z23 Encounter for immunization: Secondary | ICD-10-CM

## 2016-03-13 DIAGNOSIS — R0789 Other chest pain: Secondary | ICD-10-CM

## 2016-03-13 DIAGNOSIS — K920 Hematemesis: Secondary | ICD-10-CM

## 2016-03-13 DIAGNOSIS — F411 Generalized anxiety disorder: Secondary | ICD-10-CM

## 2016-03-13 DIAGNOSIS — E876 Hypokalemia: Secondary | ICD-10-CM

## 2016-03-13 DIAGNOSIS — F17200 Nicotine dependence, unspecified, uncomplicated: Secondary | ICD-10-CM

## 2016-03-13 DIAGNOSIS — G250 Essential tremor: Secondary | ICD-10-CM

## 2016-03-13 DIAGNOSIS — R635 Abnormal weight gain: Secondary | ICD-10-CM

## 2016-03-13 LAB — POCT URINALYSIS DIP (DEVICE)
GLUCOSE, UA: NEGATIVE mg/dL
Ketones, ur: NEGATIVE mg/dL
LEUKOCYTES UA: NEGATIVE
NITRITE: NEGATIVE
PH: 5.5 (ref 5.0–8.0)
Protein, ur: NEGATIVE mg/dL
Specific Gravity, Urine: >=1.03 (ref 1.005–1.030)
Urobilinogen, UA: 4 mg/dL — ABNORMAL HIGH (ref 0.0–1.0)

## 2016-03-13 LAB — HEMOGLOBIN A1C
Hgb A1c MFr Bld: 6 % — ABNORMAL HIGH (ref <5.7)
Mean Plasma Glucose: 126 mg/dL

## 2016-03-13 LAB — CBC WITH DIFFERENTIAL/PLATELET
BASOS ABS: 71 {cells}/uL (ref 0–200)
Basophils Relative: 1 %
EOS ABS: 0 {cells}/uL — AB (ref 15–500)
Eosinophils Relative: 0 %
HCT: 45.5 % (ref 38.5–50.0)
Hemoglobin: 15.8 g/dL (ref 13.2–17.1)
Lymphocytes Relative: 20 %
Lymphs Abs: 1420 cells/uL (ref 850–3900)
MCH: 33.9 pg — AB (ref 27.0–33.0)
MCHC: 34.7 g/dL (ref 32.0–36.0)
MCV: 97.6 fL (ref 80.0–100.0)
MONOS PCT: 10 %
MPV: 10 fL (ref 7.5–12.5)
Monocytes Absolute: 710 cells/uL (ref 200–950)
NEUTROS ABS: 4899 {cells}/uL (ref 1500–7800)
Neutrophils Relative %: 69 %
PLATELETS: 252 10*3/uL (ref 140–400)
RBC: 4.66 MIL/uL (ref 4.20–5.80)
RDW: 14 % (ref 11.0–15.0)
WBC: 7.1 10*3/uL (ref 3.8–10.8)

## 2016-03-13 MED ORDER — FOLIC ACID 1 MG PO TABS: 1.0000 mg | ORAL_TABLET | Freq: Every day | ORAL | Status: DC

## 2016-03-13 MED ORDER — VITAMIN B-1 100 MG PO TABS: 100.0000 mg | ORAL_TABLET | Freq: Every day | ORAL | Status: DC

## 2016-03-13 NOTE — Progress Notes (Signed)
Subjective:    Frederick Young ID: Frederick Young, male    DOB: 1981-08-27, 35 y.o.   MRN: [id number]  HPI Mr. Frederick Young, a 35 year old Frederick Young with a history of alcohol dependence and polysubstance abuse presents to establish care. Frederick Young is currently complaining of chest pain and vomiting blood periodically over the past several months. Frederick Young was recently evaluate in the emergency department on 02/28/2016 for atypical chest pains. Cardiac involvement was ruled out and chest xray was unremarkable. Also, D-dimer was negative. Onset was of chest pain was several months ago. Frederick Young endorses periodic heroin use. His last time using heroin was 1 week ago. Frederick Young is also followed by a Methadone clinic. Frederick Young states that chest pains occur intermittently and typically during times of high stress. Frederick Young also endorses a history of panic attacks and anxiety. Frederick Young says that his chest pains cause anxiety.   The Frederick Young describes the pain as burning and crushing.  Frederick Young rates pain as a 6/10 in intensity.  Associated symptoms are chest pain, chest pressure/discomfort and palpitations. Aggravating factors are emotional stress.  Alleviating factors are: rest. Frederick Young's cardiac risk factors are obesity (BMI >= 30 kg/m2), sedentary lifestyle, smoking/ tobacco exposure and drug use.   Frederick Young also complains of vomiting blood. Symptoms began several months ago, and have increased since that time. Frederick Young describes blood quantity as a moderate amount of brownish red blood produced when vomiting.  Frederick Young denies constant cough, cough after eating, difficulty breathing, dyspnea on exertion, dyspnea when laying down and tightness in chest. Frederick Young endorses drinking a 12 pack of beer per day over the last 8 months. Frederick Young has not attempted to quit drinking alcohol. Frederick Young appears to be irritated when asked questions pertaining to alcohol use. Frederick Young denies weight loss. Symptoms are exacerbated after drinking alcohol. [employer].   Past  Medical History  Diagnosis Date  . Anxiety   . Substance abuse   . Heart palpitations   . Hypercholesteremia    Immunization History  Administered Date(s) Administered  . Tdap 03/13/2016   Social History   Social History  . Marital Status: Single    Spouse Name: N/A  . Number of Children: 2  . Years of Education: N/A   Occupational History  . unemployeed    Social History Main Topics  . Smoking status: Former Smoker -- 1.00 packs/day for 16 years  . Smokeless tobacco: Current User  . Alcohol Use: Yes     Comment: pint of liquor a day   . Drug Use: No     Comment: heroine  . Sexual Activity: Yes   Other Topics Concern  . Not on file   Social History Narrative     Review of Systems  Constitutional: Positive for unexpected weight change. Negative for fever and fatigue.  Eyes: Negative.   Respiratory: Positive for apnea.   Cardiovascular: Positive for chest pain and palpitations. Negative for leg swelling.  Gastrointestinal: Positive for vomiting (Vomiting blood). Negative for blood in stool.  Endocrine: Negative.   Genitourinary: Negative.   Allergic/Immunologic: Negative.   Neurological: Positive for tremors.  Psychiatric/Behavioral: Positive for decreased concentration and agitation. Negative for suicidal ideas. The Frederick Young is nervous/anxious.        Objective:   Physical Exam  Constitutional: Frederick Young is oriented to person, place, and time. Frederick Young appears well-developed and well-nourished.  HENT:  Head: Normocephalic and atraumatic.  Right Ear: External ear normal.  Left Ear: External ear normal.  Nose: Nose normal.  Mouth/Throat: Oropharynx is clear and moist.  Eyes: Conjunctivae and EOM are normal. Pupils are equal, round, and reactive to light.  Injected sclera  Neck: Normal range of motion. Neck supple.  Cardiovascular: Normal rate, regular rhythm, normal heart sounds and intact distal pulses.   Pulmonary/Chest: Effort normal and breath sounds normal.   Abdominal: Soft. Bowel sounds are normal. There is no tenderness.  Increased abdominal girth  Musculoskeletal: Normal range of motion.  Neurological: Frederick Young is alert and oriented to person, place, and time. Frederick Young has normal reflexes. Frederick Young displays tremor. No cranial nerve deficit or sensory deficit. Gait normal.  Essential tremors bilaterally  Skin: Skin is warm and dry.  Psychiatric: Frederick Young has a normal mood and affect. His behavior is normal. Judgment and thought content normal.      BP 136/92 mmHg  Pulse 97  Temp(Src) 98.1 F (36.7 C) (Oral)  Resp 16  Ht 5\' 10"  (1.778 Young)  Wt 242 lb (109.77 kg)  BMI 34.72 kg/m2  SpO2 95% Assessment & Plan:   1. Alcohol dependence with unspecified alcohol-induced disorder (HCC) Notified Alcohol and drug services while Frederick Young was in office. They agreed to triage Frederick Young this am. Frederick Young is to report to [address], [address] [place]. Frederick Young's mother states that she will drive Frederick Young to facility. I will follow up with Frederick Young by phone with laboratory results.  - CBC with Differential - Magnesium - Ethanol - thiamine (VITAMIN B-1) 100 MG tablet; Take 1 tablet (100 mg total) by mouth daily.  Dispense: 30 tablet; Refill: 5 - folic acid (FOLVITE) 1 MG tablet; Take 1 tablet (1 mg total) by mouth daily.  Dispense: 30 tablet; Refill: 5  2. Atypical chest pain Reviewed EKG, normal sinus rhythm - EKG 12-Lead  3. Hypokalemia Previous potassium level was 3.0, will re-check on today.  - COMPLETE METABOLIC PANEL WITH GFR  4. Hematemesis without nausea I suspect that hematemesis is due to excessive alcohol use. I will send a referral to gastroenterology for further evaluation. Refer to #1.  - Ambulatory referral to Gastroenterology  5. Weight gain - Hemoglobin A1c - TSH - POCT urinalysis dip (device)  6. Generalized anxiety disorder GAD 7 is 14. Recommend that Frederick [facility] 8am-3pm at  [address] , [place], [place]. Frederick Young currently denies suicidal or homicidal intent.   7. Essential tremor I suspect that tremors are related to excessive alcohol use.   8. Need for Tdap vaccination  - Tdap vaccine greater than or equal to 7yo IM  9. Tobacco dependence Smoking cessation instruction/counseling given:  counseled Frederick Young on the dangers of tobacco use, advised Frederick Young to stop smoking, and reviewed strategies to maximize success    RTC: 1 month for alcohol dependence, weight gain, and hematemesis     Frederick Young M, FNP     The Frederick Young was given clear instructions to go to ER or return to medical center if symptoms do not improve, worsen or new problems develop. The Frederick Young verbalized understanding. Will notify Frederick Young with laboratory results.

## 2016-03-13 NOTE — Patient Instructions (Addendum)
Report to [facility] at [address] Located on the 1st Floor in the [facility]: The staff will triage you this am.  [phone]     [facility] [address]  [place], [place]  Finding Treatment for Addiction WHAT IS ADDICTION? Addiction is a complex disease of the brain. It causes an uncontrollable (compulsive) need for a substance. You can be addicted to alcohol, illegal drugs, or prescription medicines such as painkillers. Addiction can also be a behavior, like gambling or shopping. The need for the drug or activity can become so strong that you think about it all the time. You can also become physically dependent on a substance. Addiction can change the way your brain works. Because of these changes, getting more of whatever you are addicted to becomes the most important thing to you and feels better than other activities or relationships. Addiction can lead to changes in health, behavior, emotions, relationships, and choices that affect you and everyone around you. HOW DO I KNOW IF I NEED TREATMENT FOR ADDICTION? Addiction is a progressive disease. Without treatment, addiction can get worse. Living with addiction puts you at higher risk for injury, poor health, lost employment, loss of money, and even death. You might need treatment for addiction if:  You have tried to stop or cut down, but you cannot.  Your addiction is causing physical health problems.  You find it annoying that your friends and family are concerned about your alcohol or substance use.  You feel guilty about substance abuse or a compulsive behavior.  You have lied or tried to hide your addiction.  You need a particular substance or activity to start your day or to calm down.  You are getting in trouble at school, work, home, or with the police.  You have done something illegal to support your addiction.  You are  running out of money because of your addiction.  You have no time for anything other than your addiction. WHAT TYPES OF TREATMENT ARE AVAILABLE? The treatment program that is right for you will depend on many factors, including the type of addiction you have. Treatment programs can be outpatient or inpatient. In an outpatient program, you live at home and go to work or school, but you also go to a clinic for treatment. With an inpatient program, you live and sleep at the program facility during treatment. After treatment, you might need a plan for support during recovery. Other treatment options include:   Medicine.  Some addictions may be treated with prescription medicines.  You might also need medicine to treat anxiety or depression.  Counseling and behavior therapy. Therapy can help individuals and families behave in healthier ways and relate more effectively.  Support groups. Confidential group therapy, such as a 12-step program, can help individuals and families during treatment and recovery. No single type of program is right for everyone. Many treatment programs involve a combination of education, counseling, and a 12-step, spiritually-based approach. Some treatment programs are government sponsored. They are geared for patients who do not have private insurance. Treatment programs can vary in many respects, such as:  Cost and types of insurance that are accepted.  Types of on-site medical services that are offered.  Length of stay, setting, and size.  Overall philosophy of treatment. WHAT SHOULD I CONSIDER WHEN SELECTING A TREATMENT PROGRAM? It is important to think about your individual requirements when selecting a treatment program. There are  a number of things to consider, such as:  If the program is certified by the appropriate government agency. Even private programs must be certified and employ certified professionals.  If the program is covered by your insurance. If  finances are a concern, the first call you should make is to your insurance company, if you have health insurance. Ask for a list of treatment programs that are in your network, and confirm any copayments and deductibles that you may have to pay.  If you do not have insurance, or if you choose to attend a program that does not accept your insurance, discuss whether a payment plan can be set up.  If treatment is available in languages other than English, if needed.  If the program offers detoxification treatment, if needed.  If 12-step meetings are held at the center or if transport is available for patients to attend meetings at other locations.  If the program is professional, organized, and clean.  If the program meets all of your needs, including physical and cultural needs.  If the facility offers specific treatment for your particular addiction.  If support continues to be offered after you have left the program.  If your treatment plan is continually looked at to make sure you are receiving the right treatment at the right time.  If mental health counseling is part of your treatment.  If medicine is included in treatment, if needed.  If your family is included in your treatment plan and if support is offered to them throughout the treatment process.  How the treatment works to prevent relapse. WHERE ELSE CAN I GET HELP?  Your health care provider. Ask him or her to help you find addiction treatment. These discussions are confidential.  The [employer] on Alcoholism and Drug Dependence (NCADD). This group has information about treatment centers and programs for people who have an addiction and for family members.  The telephone number is 1-800-NCA-CALL ((951)015-11371-769-116-6393).  The website is https://ncadd.org/about-ncadd/our-affiliates  The Substance Abuse and Mental Health Services Administration [facility](SAMHSA). This group will help you find publicly funded treatment centers, help  hotlines, and counseling services near you.  The telephone number is 1-800-662-HELP (865-517-87861-641-091-2586).  The website is www.findtreatment.[employer].plsamhsa.gov In countries outside of the [place], look in [employer] directories for contact information for services in your area.   This information is not intended to replace advice given to you by your health care provider. Make sure you discuss any questions you have with your health care provider.   Document Released: 10/24/2005 Document Revised: 08/16/2015 Document Reviewed: 09/13/2014 Elsevier Interactive Patient Education 2016 Elsevier Inc. Chemical Dependency Chemical dependency is an addiction to drugs or alcohol. It is characterized by the repeated behavior of seeking out and using drugs and alcohol despite harmful consequences to the health and safety of ones self and others.  RISK FACTORS There are certain situations or behaviors that increase a person's risk for chemical dependency. These include:  A family history of chemical dependency.  A history of mental health issues, including depression and anxiety.  A home environment where drugs and alcohol are easily available to you.  Drug or alcohol use at a young age. SYMPTOMS  The following symptoms can indicate chemical dependency:  Inability to limit the use of drugs or alcohol.  Nausea, sweating, shakiness, and anxiety that occurs when alcohol or drugs are not being used.  An increase in amount of drugs or alcohol that is necessary to get drunk or high. People  who experience these symptoms can assess their use of drugs and alcohol by asking themselves the following questions:  Have you been told by friends or family that they are worried about your use of alcohol or drugs?  Do friends and family ever tell you about things you did while drinking alcohol or using drugs that you do not remember?  Do you lie about using alcohol or drugs or about the amounts you use?  Do you have  difficulty completing daily tasks unless you use alcohol or drugs?  Is the level of your work or school performance lower because of your drug or alcohol use?  Do you get sick from using drugs or alcohol but keep using anyway?  Do you feel uncomfortable in social situations unless you use alcohol or drugs?  Do you use drugs or alcohol to help forget problems? An answer of yes to any of these questions may indicate chemical dependency. Professional evaluation is suggested.   This information is not intended to replace advice given to you by your health care provider. Make sure you discuss any questions you have with your health care provider.   Document Released: 11/19/2001 Document Revised: 02/17/2012 Document Reviewed: 01/31/2011 Elsevier Interactive Patient Education 2016 Elsevier Inc. Generalized Anxiety Disorder Generalized anxiety disorder (GAD) is a mental disorder. It interferes with life functions, including relationships, work, and school. GAD is different from normal anxiety, which everyone experiences at some point in their lives in response to specific life events and activities. Normal anxiety actually helps [place] prepare for and get through these life events and activities. Normal anxiety goes away after the event or activity is over.  GAD causes anxiety that is not necessarily related to specific events or activities. It also causes excess anxiety in proportion to specific events or activities. The anxiety associated with GAD is also difficult to control. GAD can vary from mild to severe. People with severe GAD can have intense waves of anxiety with physical symptoms (panic attacks).  SYMPTOMS The anxiety and worry associated with GAD are difficult to control. This anxiety and worry are related to many life events and activities and also occur more days than not for 6 months or longer. People with GAD also have three or more of the following symptoms (one or more in  children):  Restlessness.   Fatigue.  Difficulty concentrating.   Irritability.  Muscle tension.  Difficulty sleeping or unsatisfying sleep. DIAGNOSIS GAD is diagnosed through an assessment by your health care provider. Your health care provider will ask you questions aboutyour mood,physical symptoms, and events in your life. Your health care provider may ask you about your medical history and use of alcohol or drugs, including prescription medicines. Your health care provider may also do a physical exam and blood tests. Certain medical conditions and the use of certain substances can cause symptoms similar to those associated with GAD. Your health care provider may refer you to a mental health specialist for further evaluation. TREATMENT The following therapies are usually used to treat GAD:   Medication. Antidepressant medication usually is prescribed for long-term daily control. Antianxiety medicines may be added in severe cases, especially when panic attacks occur.   Talk therapy (psychotherapy). Certain types of talk therapy can be helpful in treating GAD by providing support, education, and guidance. A form of talk therapy called cognitive behavioral therapy can teach you healthy ways to think about and react to daily life events and activities.  Stress managementtechniques. These include yoga, meditation,  and exercise and can be very helpful when they are practiced regularly. A mental health specialist can help determine which treatment is best for you. Some people see improvement with one therapy. However, other people require a combination of therapies.   This information is not intended to replace advice given to you by your health care provider. Make sure you discuss any questions you have with your health care provider.   Document Released: 03/22/2013 Document Revised: 12/16/2014 Document Reviewed: 03/22/2013 Elsevier Interactive Patient Education [employer].

## 2016-03-14 DIAGNOSIS — G250 Essential tremor: Secondary | ICD-10-CM | POA: Insufficient documentation

## 2016-03-14 DIAGNOSIS — R635 Abnormal weight gain: Secondary | ICD-10-CM | POA: Insufficient documentation

## 2016-03-14 DIAGNOSIS — E876 Hypokalemia: Secondary | ICD-10-CM | POA: Insufficient documentation

## 2016-03-14 DIAGNOSIS — R0789 Other chest pain: Secondary | ICD-10-CM | POA: Insufficient documentation

## 2016-03-14 LAB — COMPLETE METABOLIC PANEL WITH GFR
ALBUMIN: 4.1 g/dL (ref 3.6–5.1)
ALK PHOS: 146 U/L — AB (ref 40–115)
ALT: 135 U/L — AB (ref 9–46)
AST: 149 U/L — ABNORMAL HIGH (ref 10–40)
BILIRUBIN TOTAL: 0.5 mg/dL (ref 0.2–1.2)
BUN: 6 mg/dL — ABNORMAL LOW (ref 7–25)
CO2: 24 mmol/L (ref 20–31)
CREATININE: 0.78 mg/dL (ref 0.60–1.35)
Calcium: 9 mg/dL (ref 8.6–10.3)
Chloride: 99 mmol/L (ref 98–110)
GLUCOSE: 118 mg/dL — AB (ref 65–99)
Potassium: 4.2 mmol/L (ref 3.5–5.3)
Sodium: 138 mmol/L (ref 135–146)
TOTAL PROTEIN: 8.5 g/dL — AB (ref 6.1–8.1)

## 2016-03-14 LAB — TSH: TSH: 2.42 m[IU]/L (ref 0.40–4.50)

## 2016-03-14 LAB — ETHANOL: Alcohol, Ethyl (B): 76 mg/dL — ABNORMAL HIGH (ref 0–10)

## 2016-03-14 LAB — MAGNESIUM: MAGNESIUM: 2 mg/dL (ref 1.5–2.5)

## 2016-03-18 ENCOUNTER — Encounter: Payer: Self-pay | Admitting: Internal Medicine

## 2016-03-18 LAB — HEPATITIS PANEL, ACUTE
HCV Ab: REACTIVE — AB
HEP A IGM: NONREACTIVE
HEP B C IGM: NONREACTIVE
Hepatitis B Surface Ag: NEGATIVE

## 2016-03-21 ENCOUNTER — Telehealth (HOSPITAL_COMMUNITY): Payer: Self-pay | Admitting: Hematology

## 2016-03-21 LAB — HEPATITIS C RNA QUANTITATIVE

## 2016-03-21 NOTE — Telephone Encounter (Signed)
Annice PihJackie from [facility] called to say that the add on test for a quantitative Hep C panel was not able to be resulted because their was not enough specimen. / Armeniahina notified, left message on patient's answering machine to please call to have repeat labs because there was not enough of blood to run the test.

## 2016-04-01 ENCOUNTER — Ambulatory Visit: Payer: Self-pay | Admitting: Family Medicine

## 2016-04-11 ENCOUNTER — Encounter: Payer: Self-pay | Admitting: Nurse Practitioner

## 2016-04-11 ENCOUNTER — Ambulatory Visit (INDEPENDENT_AMBULATORY_CARE_PROVIDER_SITE_OTHER): Payer: No Typology Code available for payment source | Admitting: Nurse Practitioner

## 2016-04-11 ENCOUNTER — Other Ambulatory Visit (HOSPITAL_COMMUNITY)
Admission: RE | Admit: 2016-04-11 | Discharge: 2016-04-11 | Disposition: A | Discharge status: Home / Self Care | Payer: No Typology Code available for payment source | Source: Ambulatory Visit | Attending: Nurse Practitioner | Admitting: Nurse Practitioner

## 2016-04-11 ENCOUNTER — Other Ambulatory Visit: Payer: Self-pay

## 2016-04-11 VITALS — BP 107/77 | HR 97 | Temp 98.4°F | Ht 70.0 in | Wt 230.2 lb

## 2016-04-11 DIAGNOSIS — R768 Other specified abnormal immunological findings in serum: Secondary | ICD-10-CM | POA: Insufficient documentation

## 2016-04-11 DIAGNOSIS — R894 Abnormal immunological findings in specimens from other organs, systems and tissues: Secondary | ICD-10-CM

## 2016-04-11 DIAGNOSIS — K920 Hematemesis: Secondary | ICD-10-CM | POA: Insufficient documentation

## 2016-04-11 DIAGNOSIS — F1910 Other psychoactive substance abuse, uncomplicated: Secondary | ICD-10-CM | POA: Insufficient documentation

## 2016-04-11 DIAGNOSIS — R935 Abnormal findings on diagnostic imaging of other abdominal regions, including retroperitoneum: Secondary | ICD-10-CM | POA: Insufficient documentation

## 2016-04-11 DIAGNOSIS — R110 Nausea: Secondary | ICD-10-CM

## 2016-04-11 LAB — CBC WITH DIFFERENTIAL/PLATELET
BASOS ABS: 0 10*3/uL (ref 0.0–0.1)
Basophils Relative: 0 %
EOS PCT: 0 %
Eosinophils Absolute: 0 10*3/uL (ref 0.0–0.7)
HEMATOCRIT: 45.4 % (ref 39.0–52.0)
Hemoglobin: 15 g/dL (ref 13.0–17.0)
LYMPHS ABS: 1.2 10*3/uL (ref 0.7–4.0)
LYMPHS PCT: 18 %
MCH: 33.1 pg (ref 26.0–34.0)
MCHC: 33 g/dL (ref 30.0–36.0)
MCV: 100.2 fL — AB (ref 78.0–100.0)
MONO ABS: 0.7 10*3/uL (ref 0.1–1.0)
MONOS PCT: 12 %
NEUTROS ABS: 4.4 10*3/uL (ref 1.7–7.7)
Neutrophils Relative %: 70 %
PLATELETS: 266 10*3/uL (ref 150–400)
RBC: 4.53 MIL/uL (ref 4.22–5.81)
RDW: 12.9 % (ref 11.5–15.5)
WBC: 6.4 10*3/uL (ref 4.0–10.5)

## 2016-04-11 LAB — IRON AND TIBC
IRON: 25 ug/dL — AB (ref 45–182)
SATURATION RATIOS: 7 % — AB (ref 17.9–39.5)
TIBC: 377 ug/dL (ref 250–450)
UIBC: 352 ug/dL

## 2016-04-11 LAB — FERRITIN: Ferritin: 137 ng/mL (ref 24–336)

## 2016-04-11 LAB — COMPREHENSIVE METABOLIC PANEL
ALT: 99 U/L — ABNORMAL HIGH (ref 17–63)
AST: 97 U/L — ABNORMAL HIGH (ref 15–41)
Albumin: 3.6 g/dL (ref 3.5–5.0)
Alkaline Phosphatase: 60 U/L (ref 38–126)
Anion gap: 8 (ref 5–15)
BILIRUBIN TOTAL: 0.4 mg/dL (ref 0.3–1.2)
BUN: 6 mg/dL (ref 6–20)
CHLORIDE: 102 mmol/L (ref 101–111)
CO2: 28 mmol/L (ref 22–32)
Calcium: 9 mg/dL (ref 8.9–10.3)
Creatinine, Ser: 0.89 mg/dL (ref 0.61–1.24)
Glucose, Bld: 119 mg/dL — ABNORMAL HIGH (ref 65–99)
POTASSIUM: 3.8 mmol/L (ref 3.5–5.1)
Sodium: 138 mmol/L (ref 135–145)
TOTAL PROTEIN: 8 g/dL (ref 6.5–8.1)

## 2016-04-11 LAB — PROTIME-INR
INR: 1.25 (ref 0.00–1.49)
Prothrombin Time: 15.9 seconds — ABNORMAL HIGH (ref 11.6–15.2)

## 2016-04-11 MED ORDER — PANTOPRAZOLE SODIUM 40 MG PO TBEC: 40.0000 mg | DELAYED_RELEASE_TABLET | Freq: Every day | ORAL | Status: DC

## 2016-04-11 NOTE — Progress Notes (Signed)
  Primary Care Physician:  Hollis,Lachina M, FNP Primary Gastroenterologist:  Dr. Rourk  Chief Complaint  Patient presents with  . Hematemesis    HPI:   Frederick Young is a 35 y.o. male who presents On referral from primary care for hematemesis without nausea. He was last seen by internal medicine on 03/13/2016 which point he noted a history of alcohol dependence, polysubstance abuse, hematemesis. Has been evaluated in the emergency department for chest pain over the past several months with cardiac workup negative, d-dimer negative. Last heroin use was 1 week prior to internal medicine visit, also sees methadone clinic. He described his hematemesis as beginning several months prior, moderate amount of brownish red heme when he vomits. Admitted to internal medicine drinking a 12 pack a day for the past 8 months, symptoms are worse after using alcohol, provider noted patient seemed irritated when asked questions about drinking.   Labs at his last visit included elevated ethanol level is 76, small amount of urine bilirubin, alkaline phosphatase elevated at 146, AST/ALT elevated at 149/135, positive hepatitis C antibody, normal hemoglobin/hematocrit. No abdominal imaging completed in our system.  Today he states he's been having vomiting of blood, more with coughing up blood and spitting up blood. Occurring a couple months. Typically blood amount is variable, when he spits it's pretty much all blood, also wakes up in the morning with blood in his mouth. Occurs more frequently during the night, some days throughout the day. Denies abdominal pain. Admits nausea. Did have chest pain which his mom states could be epigastric as well. Also has GERD symptoms every few days. One or two episodes of hematochezia, last occurrence 1-1.5 weeks ago. Denies melena. Hematochezia associated with constipation or very loose frequent stools while undergoing detox. Has a bowel movement every 2-3 days, currently  [place] 2-3. Denies fever, chills, unintentional weight loss. Denies chest pain, dyspnea, dizziness, lightheadedness, syncope, near syncope. Denies any other upper or lower GI symptoms.  CT of the chest done 01/03/16 showed hepatic steatosis. No overt chest findings. XRay chest 02/28/16 no active cardiopulmonary disease.  Hepatitis C Risk Factors:  Birth cohort (1944 - 1964): No IV drug use: Yes Tattoos: Yes (first 2001, first ones 2013) Blood product transfusion: No HC worker: No Hemodialysis: No Maternal infection: No   Past Medical History  Diagnosis Date  . Anxiety   . Substance abuse     S/P detox 03/18/16  . Heart palpitations   . Hypercholesteremia   . Obesity   . Positive hepatitis C antibody test     Past Surgical History  Procedure Laterality Date  . None to date      Current Outpatient Prescriptions  Medication Sig Dispense Refill  . Doxylamine Succinate, Sleep, (SLEEP AID PO) Take 2 tablets by mouth daily as needed (sleep).    . pantoprazole (PROTONIX) 40 MG tablet Take 1 tablet (40 mg total) by mouth daily. 30 tablet 2  . [DISCONTINUED] gabapentin (NEURONTIN) 300 MG capsule Take 1 capsule (300 mg total) by mouth 3 (three) times daily. For Substance withdrawal syndrome (Patient not taking: Reported on 03/17/2015) 90 capsule 0  . [DISCONTINUED] QUEtiapine (SEROQUEL) 25 MG tablet Take 1 tablet (25 mg total) by mouth 3 (three) times daily. For mood control/agitation (Patient not taking: Reported on 03/17/2015) 90 tablet 0  . [DISCONTINUED] traZODone (DESYREL) 50 MG tablet Take 1 tablet (50 mg total) by mouth at bedtime and may repeat dose one time if needed. For sleep (Patient not taking: Reported   on 03/17/2015) 60 tablet 0   No current facility-administered medications for this visit.    Allergies as of 04/11/2016 - Review Complete 04/11/2016  Allergen Reaction Noted  . Bee venom Anaphylaxis 06/01/2014    Family History  Problem Relation Age of Onset  . Anxiety  disorder Mother   . Stroke Paternal Grandmother   . Cancer Paternal Grandmother   . Colon cancer Neg Hx     Social History   Social History  . Marital Status: Single    Spouse Name: N/A  . Number of Children: 2  . Years of Education: N/A   Occupational History  . unemployeed    Social History Main Topics  . Smoking status: Former Smoker -- 1.00 packs/day for 16 years    Quit date: 12/13/2015  . Smokeless tobacco: Never Used  . Alcohol Use: 0.0 oz/week    0 Standard drinks or equivalent per week     Comment: Quit ETOH 03/2016. Previously 24 oz liquor a day.  . Drug Use: Yes    Special: Amphetamines, Marijuana, Cocaine, Heroin     Comment: heroine last use this morning (04/11/16). Not using anything else currently  . Sexual Activity: Yes   Other Topics Concern  . Not on file   Social History Narrative    Review of Systems: General: Negative for anorexia, weight loss, fever, chills, fatigue, weakness. ENT: Negative for hoarseness, difficulty swallowing. CV: Negative for chest pain, angina, palpitations, peripheral edema.  Respiratory: Negative for dyspnea at rest, cough, sputum, wheezing.  GI: See history of present illness. MS: Negative for joint pain, low back pain.  Derm: Negative for rash or itching.  Endo: Negative for unusual weight change.  Heme: Negative for bruising or bleeding. Allergy: Negative for rash or hives.    Physical Exam: BP 107/77 mmHg  Pulse 97  Temp(Src) 98.4 F (36.9 C) (Oral)  Ht 5\' 10"  (1.778 m)  Wt 230 lb 3.2 oz (104.418 kg)  BMI 33.03 kg/m2 General:   Alert and oriented. Pleasant and cooperative. Well-nourished and well-developed.  Head:  Normocephalic and atraumatic. Eyes:  Without icterus, sclera clear and conjunctiva pink.  Ears:  Normal auditory acuity. Cardiovascular:  S1, S2 present without murmurs appreciated. Extremities without clubbing or edema. Respiratory:  Clear to auscultation bilaterally. No wheezes, rales, or rhonchi.  No distress.  Gastrointestinal:  +BS, soft, non-tender and non-distended. Liver margin appreciated about 3 fingerbreaths below the right costal margin. No guarding or rebound. No masses appreciated.  Rectal:  Deferred  Musculoskalatal:  Symmetrical without gross deformities. Neurologic:  Alert and oriented x4;  grossly normal neurologically. Psych:  Alert and cooperative. Normal mood and affect. Heme/Lymph/Immune: No excessive bruising noted.    04/15/2016 9:45 AM   Disclaimer: This note was dictated with voice recognition software. Similar sounding words can inadvertently be transcribed and may not be corrected upon review.

## 2016-04-11 NOTE — Patient Instructions (Signed)
1. Have your labs drawn when you're able to. 2. I printed a prescription for Protonix to take once a day. Take it 30 minutes before your first meal the day. 3. We will schedule your ultrasound 40. 4. We will schedule your procedure for you. 5. Continue to work towards quitting substances. 6. Return for follow-up in 2 months.

## 2016-04-12 LAB — CERULOPLASMIN: Ceruloplasmin: 42.5 mg/dL — ABNORMAL HIGH (ref 16.0–31.0)

## 2016-04-12 LAB — MITOCHONDRIAL ANTIBODIES: MITOCHONDRIAL M2 AB, IGG: 3.9 U (ref 0.0–20.0)

## 2016-04-12 LAB — ANTI-SMOOTH MUSCLE ANTIBODY, IGG: F-ACTIN AB IGG: 10 U (ref 0–19)

## 2016-04-12 LAB — ANTINUCLEAR ANTIBODIES, IFA: ANTINUCLEAR ANTIBODIES, IFA: NEGATIVE

## 2016-04-14 LAB — HCV RNA QUANT RFLX ULTRA OR GENOTYP

## 2016-04-14 LAB — HEPATITIS C GENOTYPE: HEPATITIS C GENOTYPE: 3

## 2016-04-14 LAB — HCV RNA (INTERNATIONAL UNITS)
HCV RNA (INTERNATIONAL UNITS): 10700000 [IU]/mL
HCV log10: 7.029 log10 IU/mL

## 2016-04-15 NOTE — Progress Notes (Signed)
cc'ed to pcp °

## 2016-04-15 NOTE — Assessment & Plan Note (Signed)
History of polysubstance abuse, has recently undergone detox. Admitted last heroin use yesterday morning. Given his ongoing heroin UC is not a candidate for hepatitis C treatment at this time. Labs and imaging as noted above to further tease out his liver profile. Return for follow-up in 2 months.

## 2016-04-15 NOTE — Assessment & Plan Note (Signed)
Noted hematemesis. Ultrasound elastography to grade his liver scarring/possible cirrhosis. Hepatitis C positive, labs as noted below. I will start him on a PPI. We will also set him up for upper endoscopy to evaluate for causes of hematemesis as well as screening for esophageal varices given his potential cirrhosis status.

## 2016-04-15 NOTE — Assessment & Plan Note (Signed)
Abnormal CT showing hepatic steatosis. Ultrasound elastography and labs as noted above.

## 2016-04-15 NOTE — Assessment & Plan Note (Signed)
Hepatitis C antibody positive. CT of the chest showed hepatic steatosis. At this point we'll proceed with labs including CBC, CMP, PTT/INR, hepatitis C RNA with reflex genotype. We'll also check iron, ferritin, ceruloplasmin, autoimmune liver labs. Ultrasound elastography to check for scarring and cirrhosis. Depending on lab results he may at some point be a candidate for treatment of his hepatitis C, although he would need to demonstrate an document no further heroin use. Return for follow-up in 2 months

## 2016-04-17 NOTE — Patient Instructions (Signed)
Frederick NeighboursChristopher J Young  04/17/2016     @PREFPERIOPPHARMACY @   Your procedure is scheduled on 04/22/16.  [facility] at 7:00 A.M.  Call this number if you have problems the morning of surgery:  [phone]   Remember:  Do not eat food or drink liquids after midnight.  Take these medicines the morning of surgery with A SIP OF WATER Protonix   Do not wear jewelry, make-up or nail polish.  Do not wear lotions, powders, or perfumes.  You may wear deodorant.  Do not shave 48 hours prior to surgery.  Men may shave face and neck.  Do not bring valuables to the hospital.  [facility] is not responsible for any belongings or valuables.  Contacts, dentures or bridgework may not be worn into surgery.  Leave your suitcase in the car.  After surgery it may be brought to your room.  For patients admitted to the hospital, discharge time will be determined by your treatment team.  Patients discharged the day of surgery will not be allowed to drive home.    Please read over the following fact sheets that you were given. Anesthesia Post-op Instructions     PATIENT INSTRUCTIONS POST-ANESTHESIA  IMMEDIATELY FOLLOWING SURGERY:  Do not drive or operate machinery for the first twenty four hours after surgery.  Do not make any important decisions for twenty four hours after surgery or while taking narcotic pain medications or sedatives.  If you develop intractable nausea and vomiting or a severe headache please notify your doctor immediately.  FOLLOW-UP:  Please make an appointment with your surgeon as instructed. You do not need to follow up with anesthesia unless specifically instructed to do so.  WOUND CARE INSTRUCTIONS (if applicable):  Keep a dry clean dressing on the anesthesia/puncture wound site if there is drainage.  Once the wound has quit draining you may leave it open to air.  Generally you should leave the bandage intact for twenty four hours unless there is drainage.  If the  epidural site drains for more than 36-48 hours please call the anesthesia department.  QUESTIONS?:  Please feel free to call your physician or the hospital operator if you have any questions, and they will be happy to assist you.      Esophagogastroduodenoscopy Esophagogastroduodenoscopy (EGD) is a procedure that is used to examine the lining of the esophagus, stomach, and first part of the small intestine (duodenum). A long, flexible, lighted tube with a camera attached (endoscope) is inserted down the throat to view these organs. This procedure is done to detect problems or abnormalities, such as inflammation, bleeding, ulcers, or growths, in order to treat them. The procedure lasts 5-20 minutes. It is usually an outpatient procedure, but it may need to be performed in a hospital in emergency cases. LET [facility] KNOW ABOUT:  Any allergies you have.  All medicines you are taking, including vitamins, herbs, eye drops, creams, and over-the-counter medicines.  Previous problems you or members of your family have had with the use of anesthetics.  Any blood disorders you have.  Previous surgeries you have had.  Medical conditions you have. RISKS AND COMPLICATIONS Generally, this is a safe procedure. However, problems can occur and include:  Infection.  Bleeding.  Tearing (perforation) of the esophagus, stomach, or duodenum.  Difficulty breathing or not being able to breathe.  Excessive sweating.  Spasms of the larynx.  Slowed heartbeat.  Low blood pressure. BEFORE THE PROCEDURE  Do not  eat or drink anything after midnight on the night before the procedure or as directed by your health care provider.  Do not take your regular medicines before the procedure if your health care provider asks you not to. Ask your health care provider about changing or stopping those medicines.  If you wear dentures, be prepared to remove them before the procedure.  Arrange for  someone to drive you home after the procedure. PROCEDURE  A numbing medicine (local anesthetic) may be sprayed in your throat for comfort and to stop you from gagging or coughing.  You will have an IV tube inserted in a vein in your hand or arm. You will receive medicines and fluids through this tube.  You will be given a medicine to relax you (sedative).  A pain reliever will be given through the IV tube.  A mouth guard may be placed in your mouth to protect your teeth and to keep you from biting on the endoscope.  You will be asked to lie on your left side.  The endoscope will be inserted down your throat and into your esophagus, stomach, and duodenum.  Air will be put through the endoscope to allow your health care provider to clearly view the lining of your esophagus.  The lining of your esophagus, stomach, and duodenum will be examined. During the exam, your health care provider may:  Remove tissue to be examined under a microscope (biopsy) for inflammation, infection, or other medical problems.  Remove growths.  Remove objects (foreign bodies) that are stuck.  Treat any bleeding with medicines or other devices that stop tissues from bleeding (hot cautery, clipping devices).  Widen (dilate) or stretch narrowed areas of your esophagus and stomach.  The endoscope will be withdrawn. AFTER THE PROCEDURE  You will be taken to a recovery area for observation. Your blood pressure, heart rate, breathing rate, and blood oxygen level will be monitored often until the medicines you were given have worn off.  Do not eat or drink anything until the numbing medicine has worn off and your gag reflex has returned. You may choke.  Your health care provider should be able to discuss his or her findings with you. It will take longer to discuss the test results if any biopsies were taken.   This information is not intended to replace advice given to you by your health care provider. Make  sure you discuss any questions you have with your health care provider.   Document Released: 03/28/2005 Document Revised: 12/16/2014 Document Reviewed: 10/28/2012 Elsevier Interactive Patient Education [employer]

## 2016-04-18 ENCOUNTER — Encounter (HOSPITAL_COMMUNITY)
Admission: RE | Admit: 2016-04-18 | Discharge: 2016-04-18 | Disposition: A | Discharge status: Home / Self Care | Payer: No Typology Code available for payment source | Source: Ambulatory Visit | Attending: Internal Medicine | Admitting: Internal Medicine

## 2016-04-18 ENCOUNTER — Encounter (HOSPITAL_COMMUNITY): Payer: Self-pay

## 2016-04-18 ENCOUNTER — Ambulatory Visit (HOSPITAL_COMMUNITY)
Admission: RE | Admit: 2016-04-18 | Discharge: 2016-04-18 | Disposition: A | Discharge status: Home / Self Care | Payer: No Typology Code available for payment source | Source: Ambulatory Visit | Attending: Nurse Practitioner | Admitting: Nurse Practitioner

## 2016-04-18 ENCOUNTER — Ambulatory Visit: Payer: Self-pay | Admitting: Family Medicine

## 2016-04-18 DIAGNOSIS — R768 Other specified abnormal immunological findings in serum: Secondary | ICD-10-CM

## 2016-04-18 DIAGNOSIS — R894 Abnormal immunological findings in specimens from other organs, systems and tissues: Secondary | ICD-10-CM | POA: Insufficient documentation

## 2016-04-18 DIAGNOSIS — R110 Nausea: Secondary | ICD-10-CM | POA: Insufficient documentation

## 2016-04-18 DIAGNOSIS — F1910 Other psychoactive substance abuse, uncomplicated: Secondary | ICD-10-CM | POA: Insufficient documentation

## 2016-04-18 DIAGNOSIS — R935 Abnormal findings on diagnostic imaging of other abdominal regions, including retroperitoneum: Secondary | ICD-10-CM | POA: Insufficient documentation

## 2016-04-18 DIAGNOSIS — K760 Fatty (change of) liver, not elsewhere classified: Secondary | ICD-10-CM | POA: Insufficient documentation

## 2016-04-18 DIAGNOSIS — K920 Hematemesis: Secondary | ICD-10-CM | POA: Insufficient documentation

## 2016-04-19 ENCOUNTER — Ambulatory Visit: Payer: Self-pay | Admitting: Family Medicine

## 2016-04-22 ENCOUNTER — Ambulatory Visit (HOSPITAL_COMMUNITY)
Admission: RE | Admit: 2016-04-22 | Discharge: 2016-04-22 | Disposition: A | Discharge status: Home / Self Care | Payer: Self-pay | Source: Ambulatory Visit | Attending: Internal Medicine | Admitting: Internal Medicine

## 2016-04-22 ENCOUNTER — Ambulatory Visit (HOSPITAL_COMMUNITY): Payer: Self-pay | Admitting: Anesthesiology

## 2016-04-22 ENCOUNTER — Encounter: Payer: Self-pay | Admitting: Internal Medicine

## 2016-04-22 ENCOUNTER — Encounter (HOSPITAL_COMMUNITY)
Admission: RE | Disposition: A | Discharge status: Home / Self Care | Payer: Self-pay | Source: Ambulatory Visit | Attending: Internal Medicine

## 2016-04-22 ENCOUNTER — Encounter (HOSPITAL_COMMUNITY): Payer: Self-pay | Admitting: *Deleted

## 2016-04-22 DIAGNOSIS — Z809 Family history of malignant neoplasm, unspecified: Secondary | ICD-10-CM | POA: Insufficient documentation

## 2016-04-22 DIAGNOSIS — K209 Esophagitis, unspecified: Secondary | ICD-10-CM | POA: Insufficient documentation

## 2016-04-22 DIAGNOSIS — K920 Hematemesis: Secondary | ICD-10-CM | POA: Insufficient documentation

## 2016-04-22 DIAGNOSIS — E7800 Pure hypercholesterolemia, unspecified: Secondary | ICD-10-CM | POA: Insufficient documentation

## 2016-04-22 DIAGNOSIS — K2100 Gastro-esophageal reflux disease with esophagitis, without bleeding: Secondary | ICD-10-CM | POA: Insufficient documentation

## 2016-04-22 DIAGNOSIS — E669 Obesity, unspecified: Secondary | ICD-10-CM | POA: Insufficient documentation

## 2016-04-22 DIAGNOSIS — F419 Anxiety disorder, unspecified: Secondary | ICD-10-CM | POA: Insufficient documentation

## 2016-04-22 DIAGNOSIS — Z6833 Body mass index (BMI) 33.0-33.9, adult: Secondary | ICD-10-CM | POA: Insufficient documentation

## 2016-04-22 DIAGNOSIS — R110 Nausea: Secondary | ICD-10-CM | POA: Insufficient documentation

## 2016-04-22 DIAGNOSIS — Z79899 Other long term (current) drug therapy: Secondary | ICD-10-CM | POA: Insufficient documentation

## 2016-04-22 DIAGNOSIS — Z538 Procedure and treatment not carried out for other reasons: Secondary | ICD-10-CM | POA: Insufficient documentation

## 2016-04-22 DIAGNOSIS — Z87891 Personal history of nicotine dependence: Secondary | ICD-10-CM | POA: Insufficient documentation

## 2016-04-22 HISTORY — PX: ESOPHAGOGASTRODUODENOSCOPY (EGD) WITH PROPOFOL: SHX5813

## 2016-04-22 SURGERY — ESOPHAGOGASTRODUODENOSCOPY (EGD) WITH PROPOFOL
Anesthesia: Monitor Anesthesia Care

## 2016-04-22 MED ORDER — LACTATED RINGERS IV SOLN
INTRAVENOUS | Status: DC
  Administered 2016-04-22: 08:00:00 via INTRAVENOUS

## 2016-04-22 MED ORDER — ONDANSETRON HCL 4 MG/2ML IJ SOLN: 4.0000 mg | Freq: Once | INTRAMUSCULAR | Status: DC | PRN

## 2016-04-22 MED ORDER — FENTANYL CITRATE (PF) 100 MCG/2ML IJ SOLN: 25.0000 ug | INTRAMUSCULAR | Status: DC | PRN

## 2016-04-22 MED ORDER — FENTANYL CITRATE (PF) 100 MCG/2ML IJ SOLN
25.0000 ug | INTRAMUSCULAR | Status: AC
  Administered 2016-04-22 (×2): 25 ug via INTRAVENOUS

## 2016-04-22 MED ORDER — MIDAZOLAM HCL 2 MG/2ML IJ SOLN
1.0000 mg | INTRAMUSCULAR | Status: DC | PRN
  Administered 2016-04-22: 2 mg via INTRAVENOUS

## 2016-04-22 MED ORDER — MIDAZOLAM HCL 2 MG/2ML IJ SOLN
INTRAMUSCULAR | Status: AC
  Filled 2016-04-22: qty 2

## 2016-04-22 MED ORDER — PROPOFOL 10 MG/ML IV BOLUS
INTRAVENOUS | Status: DC | PRN
  Administered 2016-04-22: 20 mg via INTRAVENOUS

## 2016-04-22 MED ORDER — PROPOFOL 500 MG/50ML IV EMUL
INTRAVENOUS | Status: DC | PRN
  Administered 2016-04-22: 125 ug/kg/min via INTRAVENOUS

## 2016-04-22 MED ORDER — FENTANYL CITRATE (PF) 100 MCG/2ML IJ SOLN
INTRAMUSCULAR | Status: AC
  Filled 2016-04-22: qty 2

## 2016-04-22 MED ORDER — LIDOCAINE VISCOUS 2 % MT SOLN
5.0000 mL | OROMUCOSAL | Status: AC
  Administered 2016-04-22 (×2): 5 mL via OROMUCOSAL

## 2016-04-22 MED ORDER — MIDAZOLAM HCL 5 MG/5ML IJ SOLN
INTRAMUSCULAR | Status: DC | PRN
  Administered 2016-04-22: 1 mg via INTRAVENOUS

## 2016-04-22 MED ORDER — LIDOCAINE VISCOUS 2 % MT SOLN
OROMUCOSAL | Status: AC
  Filled 2016-04-22: qty 15

## 2016-04-22 NOTE — Op Note (Signed)
[facility] Patient Name: Frederick KinChristopher Young Procedure Date: 04/22/2016 8:37 AM MRN: [id number] Date of Birth: 31-May-1981 Attending MD: Gennette Pacobert Michael Cale Bethard , MD CSN: [id number] Age: [id number] Admit Type: Outpatient Procedure:                Upper GI endoscopy ?"incomplete?"stomach full of food                            debris Indications:              Hematemesis Providers:                Gennette Pacobert Michael Hutton Pell[place]ane, MD, Nena PolioLisa Moore, RN, Tammy                            Vaught, RN, Burke Keelsrisann Tilley, Technician Referring MD:             Rosalene BillingsLachina M. Hollis Medicines:                Monitored Anesthesia Care Complications:            No immediate complications. Estimated Blood Loss:     Estimated blood loss: none. Procedure:                Pre-Anesthesia Assessment:                           - Prior to the procedure, a History and Physical                            was performed, and patient medications and                            allergies were reviewed. The patient's tolerance of                            previous anesthesia was also reviewed. The risks                            and benefits of the procedure and the sedation                            options and risks were discussed with the patient.                            All questions were answered, and informed consent                            was obtained. Prior Anticoagulants: The patient has                            taken no previous anticoagulant or antiplatelet                            agents. ASA Grade Assessment: III - A patient with  severe systemic disease. After reviewing the risks                            and benefits, the patient was deemed in                            satisfactory condition to undergo the procedure.                           After obtaining informed consent, the endoscope was                            passed under direct vision. Throughout the        procedure, the patient's blood pressure, pulse, and                            oxygen saturations were monitored continuously. The                            EG-[id number] ([id number]) scope was introduced through the                            mouth, and advanced to the second part of duodenum.                            The upper GI endoscopy was accomplished without                            difficulty. The patient tolerated the procedure                            well. Scope In: 8:49:19 AM Scope Out: 8:51:52 AM Total Procedure Duration: 0 hours 2 minutes 33 seconds  Findings:      LA Grade A (one or more mucosal breaks less than 5 mm, not extending       between tops of 2 mucosal folds) esophagitis was found 36 cm from the       incisors. No Barrett's epithelium seen      gastric cavity with a significant amount of retained food debris. A good       bit of the gastric mucosa not seen. A patent pylorus. Normal-appearing       first and second portion of the duodenum.      The second portion of the duodenum was normal. Impression:               - LA Grade A esophagitis.                           - retained gastric contents precluded complete                            examination                           - Normal second portion of the duodenum.                           -  No specimens collected. Moderate Sedation:      Moderate (conscious) sedation was personally administered by an       anesthesia professional. The following parameters were monitored: oxygen       saturation, heart rate, blood pressure, respiratory rate, EKG, adequacy       of pulmonary ventilation, and response to care. Total physician       intraservice time was 11 minutes. Recommendation:           - Patient has a contact number available for                            emergencies. The signs and symptoms of potential                            delayed complications were discussed with the                             patient. Return to normal activities tomorrow.                            Written discharge instructions were provided to the                            patient.                           - Advance diet as tolerated.                           - Continue present medications including Protonix                            40 mg daily.                           - Return to GI office in 3 weeks. Procedure Code(s):        --- Professional ---                           [phone], Esophagogastroduodenoscopy, flexible,                            transoral; diagnostic, including collection of                            specimen(s) by brushing or washing, when performed                            (separate procedure) Diagnosis Code(s):        --- Professional ---                           K20.9, Esophagitis, unspecified                           K92.0, Hematemesis CPT copyright 2016 American Medical Association. All rights reserved. The codes documented in  this report are preliminary and upon coder review may  be revised to meet current compliance requirements. Gerrit Friends. Baneza Bartoszek, MD Gennette Pac, MD 04/22/2016 9:07:01 AM This report has been signed electronically. Number of Addenda: 0

## 2016-04-22 NOTE — Anesthesia Postprocedure Evaluation (Signed)
Anesthesia Post Note  Patient: Frederick Young  Procedure(s) Performed: Procedure(s) (LRB): ESOPHAGOGASTRODUODENOSCOPY (EGD) WITH PROPOFOL (N/A)  Patient location during evaluation: PACU Anesthesia Type: MAC Level of consciousness: awake and alert Pain management: pain level controlled Vital Signs Assessment: post-procedure vital signs reviewed and stable Respiratory status: spontaneous breathing Cardiovascular status: blood pressure returned to baseline Postop Assessment: no signs of nausea or vomiting Anesthetic complications: no    Last Vitals:  Filed Vitals:   04/22/16 0915 04/22/16 0930  BP: 113/70 113/70  Pulse:    Temp:    Resp: 16 14    Last Pain: There were no vitals filed for this visit.               Ahniya Mitchum

## 2016-04-22 NOTE — Transfer of Care (Signed)
Immediate Anesthesia Transfer of Care Note  Patient: Frederick Young  Procedure(s) Performed: Procedure(s) with comments: ESOPHAGOGASTRODUODENOSCOPY (EGD) WITH PROPOFOL (N/A) - 0830  Patient Location: PACU  Anesthesia Type:MAC  Level of Consciousness: awake, alert  and oriented  Airway & Oxygen Therapy: Patient Spontanous Breathing and Patient connected to nasal cannula oxygen  Post-op Assessment: Report given to RN  Post vital signs: Reviewed and stable  Last Vitals:  Filed Vitals:   04/22/16 0825 04/22/16 0830  BP: 123/87 117/78  Pulse:    Temp:    Resp: 26 12    Last Pain: There were no vitals filed for this visit.    Patients Stated Pain Goal: 8 (04/22/16 0729)  Complications: No apparent anesthesia complications

## 2016-04-22 NOTE — Interval H&P Note (Signed)
History and Physical Interval Note:  04/22/2016 8:28 AM  Frederick Young  has presented today for surgery, with the diagnosis of hepatoemesis, abnormal CT  The various methods of treatment have been discussed with the patient and family. After consideration of risks, benefits and other options for treatment, the patient has consented to  Procedure(s) with comments: ESOPHAGOGASTRODUODENOSCOPY (EGD) WITH PROPOFOL (N/A) - 0830 as a surgical intervention .  The patient's history has been reviewed, patient examined, no change in status, stable for surgery.  I have reviewed the patient's chart and labs.  Questions were answered to the patient's satisfaction.     Frederick Young  No change. Reflux symptoms are better on Protonix. Patient denies dysphagia.  The risks, benefits, limitations, alternatives and imponderables have been reviewed with the patient. Potential for esophageal dilation, biopsy, etc. have also been reviewed.  Questions have been answered. All parties agreeable.

## 2016-04-22 NOTE — H&P (View-Only) (Signed)
Primary Care Physician:  Massie Maroon, FNP Primary Gastroenterologist:  Dr. Jena Gauss  Chief Complaint  Patient presents with  . Hematemesis    HPI:   Frederick Young is a 36 y.o. male who presents On referral from primary care for hematemesis without nausea. He was last seen by internal medicine on 03/13/2016 which point he noted a history of alcohol dependence, polysubstance abuse, hematemesis. Has been evaluated in the emergency department for chest pain over the past several months with cardiac workup negative, d-dimer negative. Last heroin use was 1 week prior to internal medicine visit, also sees methadone clinic. He described his hematemesis as beginning several months prior, moderate amount of brownish red heme when he vomits. Admitted to internal medicine drinking a 12 pack a day for the past 8 months, symptoms are worse after using alcohol, provider noted patient seemed irritated when asked questions about drinking.   Labs at his last visit included elevated ethanol level is 76, small amount of urine bilirubin, alkaline phosphatase elevated at 146, AST/ALT elevated at 149/135, positive hepatitis C antibody, normal hemoglobin/hematocrit. No abdominal imaging completed in our system.  Today he states he's been having vomiting of blood, more with coughing up blood and spitting up blood. Occurring a couple months. Typically blood amount is variable, when he spits it's pretty much all blood, also wakes up in the morning with blood in his mouth. Occurs more frequently during the night, some days throughout the day. Denies abdominal pain. Admits nausea. Did have chest pain which his mom states could be epigastric as well. Also has GERD symptoms every few days. One or two episodes of hematochezia, last occurrence 1-1.5 weeks ago. Denies melena. Hematochezia associated with constipation or very loose frequent stools while undergoing detox. Has a bowel movement every 2-3 days, currently  [place] 2-3. Denies fever, chills, unintentional weight loss. Denies chest pain, dyspnea, dizziness, lightheadedness, syncope, near syncope. Denies any other upper or lower GI symptoms.  CT of the chest done 01/03/16 showed hepatic steatosis. No overt chest findings. XRay chest 02/28/16 no active cardiopulmonary disease.  Hepatitis C Risk Factors:  Birth cohort (1944 - 1964): No IV drug use: Yes Tattoos: Yes (first 2001, first ones 2013) Blood product transfusion: No HC worker: No Hemodialysis: No Maternal infection: No   Past Medical History  Diagnosis Date  . Anxiety   . Substance abuse     S/P detox 03/18/16  . Heart palpitations   . Hypercholesteremia   . Obesity   . Positive hepatitis C antibody test     Past Surgical History  Procedure Laterality Date  . None to date      Current Outpatient Prescriptions  Medication Sig Dispense Refill  . Doxylamine Succinate, Sleep, (SLEEP AID PO) Take 2 tablets by mouth daily as needed (sleep).    . pantoprazole (PROTONIX) 40 MG tablet Take 1 tablet (40 mg total) by mouth daily. 30 tablet 2  . [DISCONTINUED] gabapentin (NEURONTIN) 300 MG capsule Take 1 capsule (300 mg total) by mouth 3 (three) times daily. For Substance withdrawal syndrome (Patient not taking: Reported on 03/17/2015) 90 capsule 0  . [DISCONTINUED] QUEtiapine (SEROQUEL) 25 MG tablet Take 1 tablet (25 mg total) by mouth 3 (three) times daily. For mood control/agitation (Patient not taking: Reported on 03/17/2015) 90 tablet 0  . [DISCONTINUED] traZODone (DESYREL) 50 MG tablet Take 1 tablet (50 mg total) by mouth at bedtime and may repeat dose one time if needed. For sleep (Patient not taking: Reported  on 03/17/2015) 60 tablet 0   No current facility-administered medications for this visit.    Allergies as of 04/11/2016 - Review Complete 04/11/2016  Allergen Reaction Noted  . Bee venom Anaphylaxis 06/01/2014    Family History  Problem Relation Age of Onset  . Anxiety  disorder Mother   . Stroke Paternal Grandmother   . Cancer Paternal Grandmother   . Colon cancer Neg Hx     Social History   Social History  . Marital Status: Single    Spouse Name: N/A  . Number of Children: 2  . Years of Education: N/A   Occupational History  . unemployeed    Social History Main Topics  . Smoking status: Former Smoker -- 1.00 packs/day for 16 years    Quit date: 12/13/2015  . Smokeless tobacco: Never Used  . Alcohol Use: 0.0 oz/week    0 Standard drinks or equivalent per week     Comment: Quit ETOH 03/2016. Previously 24 oz liquor a day.  . Drug Use: Yes    Special: Amphetamines, Marijuana, Cocaine, Heroin     Comment: heroine last use this morning (04/11/16). Not using anything else currently  . Sexual Activity: Yes   Other Topics Concern  . Not on file   Social History Narrative    Review of Systems: General: Negative for anorexia, weight loss, fever, chills, fatigue, weakness. ENT: Negative for hoarseness, difficulty swallowing. CV: Negative for chest pain, angina, palpitations, peripheral edema.  Respiratory: Negative for dyspnea at rest, cough, sputum, wheezing.  GI: See history of present illness. MS: Negative for joint pain, low back pain.  Derm: Negative for rash or itching.  Endo: Negative for unusual weight change.  Heme: Negative for bruising or bleeding. Allergy: Negative for rash or hives.    Physical Exam: BP 107/77 mmHg  Pulse 97  Temp(Src) 98.4 F (36.9 C) (Oral)  Ht 5\' 10"  (1.778 m)  Wt 230 lb 3.2 oz (104.418 kg)  BMI 33.03 kg/m2 General:   Alert and oriented. Pleasant and cooperative. Well-nourished and well-developed.  Head:  Normocephalic and atraumatic. Eyes:  Without icterus, sclera clear and conjunctiva pink.  Ears:  Normal auditory acuity. Cardiovascular:  S1, S2 present without murmurs appreciated. Extremities without clubbing or edema. Respiratory:  Clear to auscultation bilaterally. No wheezes, rales, or rhonchi.  No distress.  Gastrointestinal:  +BS, soft, non-tender and non-distended. Liver margin appreciated about 3 fingerbreaths below the right costal margin. No guarding or rebound. No masses appreciated.  Rectal:  Deferred  Musculoskalatal:  Symmetrical without gross deformities. Neurologic:  Alert and oriented x4;  grossly normal neurologically. Psych:  Alert and cooperative. Normal mood and affect. Heme/Lymph/Immune: No excessive bruising noted.    04/15/2016 9:45 AM   Disclaimer: This note was dictated with voice recognition software. Similar sounding words can inadvertently be transcribed and may not be corrected upon review.

## 2016-04-22 NOTE — Anesthesia Preprocedure Evaluation (Signed)
Anesthesia Evaluation  Patient identified by MRN, date of birth, ID band Patient awake    Reviewed: Allergy & Precautions, NPO status , Patient's Chart, lab work & pertinent test results  Airway Mallampati: II  TM Distance: >3 FB Neck ROM: Full    Dental  (+) Poor Dentition   Pulmonary former smoker,    breath sounds clear to auscultation       Cardiovascular + dysrhythmias (palpitations)  Rhythm:Regular Rate:Normal     Neuro/Psych PSYCHIATRIC DISORDERS Anxiety    GI/Hepatic (+)     substance abuse (benzo+opiate abuse)  alcohol use, Hepatitis -, C  Endo/Other    Renal/GU      Musculoskeletal   Abdominal   Peds  Hematology   Anesthesia Other Findings   Reproductive/Obstetrics                             Anesthesia Physical Anesthesia Plan  ASA: III  Anesthesia Plan: MAC   Post-op Pain Management:    Induction: Intravenous  Airway Management Planned: Simple Face Mask  Additional Equipment:   Intra-op Plan:   Post-operative Plan:   Informed Consent: I have reviewed the patients History and Physical, chart, labs and discussed the procedure including the risks, benefits and alternatives for the proposed anesthesia with the patient or authorized representative who has indicated his/her understanding and acceptance.     Plan Discussed with:   Anesthesia Plan Comments:         Anesthesia Quick Evaluation

## 2016-04-22 NOTE — Discharge Instructions (Signed)
EGD Discharge instructions Please read the instructions outlined below and refer to this sheet in the next few weeks. These discharge instructions provide you with general information on caring for yourself after you leave the hospital. Your doctor may also give you specific instructions. While your treatment has been planned according to the most current medical practices available, unavoidable complications occasionally occur. If you have any problems or questions after discharge, please call your doctor. ACTIVITY  You may resume your regular activity but move at a slower pace for the next 24 hours.   Take frequent rest periods for the next 24 hours.   Walking will help expel (get rid of) the air and reduce the bloated feeling in your abdomen.   No driving for 24 hours (because of the anesthesia (medicine) used during the test).   You may shower.   Do not sign any important legal documents or operate any machinery for 24 hours (because of the anesthesia used during the test).  NUTRITION  Drink plenty of fluids.   You may resume your normal diet.   Begin with a light meal and progress to your normal diet.   Avoid alcoholic beverages for 24 hours or as instructed by your caregiver.  MEDICATIONS  You may resume your normal medications unless your caregiver tells you otherwise.  WHAT YOU CAN EXPECT TODAY  You may experience abdominal discomfort such as a feeling of fullness or gas pains.  FOLLOW-UP  Your doctor will discuss the results of your test with you.  SEEK IMMEDIATE MEDICAL ATTENTION IF ANY OF THE FOLLOWING OCCUR:  Excessive nausea (feeling sick to your stomach) and/or vomiting.   Severe abdominal pain and distention (swelling).   Trouble swallowing.   Temperature over 101 F (37.8 C).   Rectal bleeding or vomiting of blood.     Stomach full of food. Unable to complete examination.  Continue Protonix daily.  You will be scheduled for a follow-up office  visit  in 2-3 weeks

## 2016-04-23 ENCOUNTER — Other Ambulatory Visit: Payer: Self-pay

## 2016-04-23 DIAGNOSIS — K7460 Unspecified cirrhosis of liver: Secondary | ICD-10-CM

## 2016-04-24 ENCOUNTER — Encounter (HOSPITAL_COMMUNITY): Payer: Self-pay | Admitting: Internal Medicine

## 2016-04-25 ENCOUNTER — Telehealth: Payer: Self-pay

## 2016-04-25 NOTE — Telephone Encounter (Signed)
Mother called and states that the referral to the [facility] is not covered under pt insurance. STates she doesn't have the money for his visit which is $400.00.  Spoke with Minerva AreolaEric whom made the referral and he states do find someone that will take pt and whom may have pt assistance.

## 2016-04-30 NOTE — Telephone Encounter (Signed)
Sent referral to [facility]

## 2016-05-13 ENCOUNTER — Other Ambulatory Visit (HOSPITAL_COMMUNITY)
Admission: RE | Admit: 2016-05-13 | Discharge: 2016-05-13 | Disposition: A | Discharge status: Home / Self Care | Payer: No Typology Code available for payment source | Source: Ambulatory Visit | Attending: Nurse Practitioner | Admitting: Nurse Practitioner

## 2016-05-13 ENCOUNTER — Encounter: Payer: Self-pay | Admitting: Nurse Practitioner

## 2016-05-13 ENCOUNTER — Ambulatory Visit (INDEPENDENT_AMBULATORY_CARE_PROVIDER_SITE_OTHER): Payer: No Typology Code available for payment source | Admitting: Nurse Practitioner

## 2016-05-13 VITALS — BP 120/84 | HR 74 | Temp 97.5°F | Ht 70.0 in | Wt 213.4 lb

## 2016-05-13 DIAGNOSIS — B182 Chronic viral hepatitis C: Secondary | ICD-10-CM | POA: Insufficient documentation

## 2016-05-13 DIAGNOSIS — K2100 Gastro-esophageal reflux disease with esophagitis, without bleeding: Secondary | ICD-10-CM

## 2016-05-13 DIAGNOSIS — K7469 Other cirrhosis of liver: Secondary | ICD-10-CM

## 2016-05-13 DIAGNOSIS — F1910 Other psychoactive substance abuse, uncomplicated: Secondary | ICD-10-CM

## 2016-05-13 DIAGNOSIS — R894 Abnormal immunological findings in specimens from other organs, systems and tissues: Secondary | ICD-10-CM | POA: Insufficient documentation

## 2016-05-13 DIAGNOSIS — R110 Nausea: Secondary | ICD-10-CM

## 2016-05-13 DIAGNOSIS — K7460 Unspecified cirrhosis of liver: Secondary | ICD-10-CM | POA: Insufficient documentation

## 2016-05-13 DIAGNOSIS — F1029 Alcohol dependence with unspecified alcohol-induced disorder: Secondary | ICD-10-CM

## 2016-05-13 DIAGNOSIS — K920 Hematemesis: Secondary | ICD-10-CM

## 2016-05-13 LAB — CBC WITH DIFFERENTIAL/PLATELET
Basophils Absolute: 0 10*3/uL (ref 0.0–0.1)
Basophils Relative: 1 %
Eosinophils Absolute: 0.2 10*3/uL (ref 0.0–0.7)
Eosinophils Relative: 3 %
HEMATOCRIT: 48 % (ref 39.0–52.0)
Hemoglobin: 15.5 g/dL (ref 13.0–17.0)
LYMPHS ABS: 2 10*3/uL (ref 0.7–4.0)
LYMPHS PCT: 36 %
MCH: 30.8 pg (ref 26.0–34.0)
MCHC: 32.3 g/dL (ref 30.0–36.0)
MCV: 95.2 fL (ref 78.0–100.0)
MONO ABS: 0.5 10*3/uL (ref 0.1–1.0)
MONOS PCT: 8 %
NEUTROS ABS: 2.9 10*3/uL (ref 1.7–7.7)
Neutrophils Relative %: 52 %
Platelets: 221 10*3/uL (ref 150–400)
RBC: 5.04 MIL/uL (ref 4.22–5.81)
RDW: 13 % (ref 11.5–15.5)
WBC: 5.5 10*3/uL (ref 4.0–10.5)

## 2016-05-13 LAB — COMPREHENSIVE METABOLIC PANEL
ALT: 95 U/L — ABNORMAL HIGH (ref 17–63)
ANION GAP: 6 (ref 5–15)
AST: 87 U/L — ABNORMAL HIGH (ref 15–41)
Albumin: 4.1 g/dL (ref 3.5–5.0)
Alkaline Phosphatase: 64 U/L (ref 38–126)
BILIRUBIN TOTAL: 0.6 mg/dL (ref 0.3–1.2)
BUN: 10 mg/dL (ref 6–20)
CO2: 28 mmol/L (ref 22–32)
Calcium: 9.4 mg/dL (ref 8.9–10.3)
Chloride: 102 mmol/L (ref 101–111)
Creatinine, Ser: 1.02 mg/dL (ref 0.61–1.24)
GFR calc Af Amer: >60 mL/min (ref >60)
Glucose, Bld: 108 mg/dL — ABNORMAL HIGH (ref 65–99)
POTASSIUM: 3.9 mmol/L (ref 3.5–5.1)
Sodium: 136 mmol/L (ref 135–145)
TOTAL PROTEIN: 8.1 g/dL (ref 6.5–8.1)

## 2016-05-13 LAB — PROTIME-INR
INR: 1.21 (ref 0.00–1.49)
PROTHROMBIN TIME: 15.5 s — AB (ref 11.6–15.2)

## 2016-05-13 MED ORDER — PANTOPRAZOLE SODIUM 40 MG PO TBEC: 40.0000 mg | DELAYED_RELEASE_TABLET | Freq: Two times a day (BID) | ORAL | Status: DC

## 2016-05-13 MED ORDER — PANTOPRAZOLE SODIUM 40 MG PO TBEC
40.0000 mg | DELAYED_RELEASE_TABLET | Freq: Two times a day (BID) | ORAL | Status: DC
Start: 2016-05-13 — End: 2016-05-13

## 2016-05-13 NOTE — Patient Instructions (Addendum)
1. I've printed prescription for Protonix twice daily. 2. We'll try to help you find a primary care provider. 3. Have your labs drawn when you're able to. 4. Avoid IV drugs and alcohol. Do not take large doses of Tylenol (take less than 2000 mg a day at most). 5. We have check on status of your referral to infectious disease, first available appointment likely 1-1/2 months away. They should call you. 6. Return for follow-up in 3 months

## 2016-05-13 NOTE — Progress Notes (Signed)
Referring Provider: Massie Maroon, FNP Primary Care Physician:  Massie Maroon, FNP Primary GI:  Dr. Jena Gauss  Chief Complaint  Patient presents with  . Follow-up  . set up EGD  . check on referral to liver clinic    HPI:   Frederick Young is a 35 y.o. male who presents on follow-up and to set up upper endoscopy. He was last seen in our office 04/11/2016 for hep C positive, abnormal CT abdomen, polysubstance abuse, hematemesis with nausea. Visit prior to last had elevated ethanol level at 76. Primary care notes history of alcohol dependence, polysubstance abuse, hematemesis. Has a relatively recent use of heroin, sees methadone clinic. Notes possible hematemesis, worse after drinking. At his last visit he also noted GERD symptoms regularly, one or 2 episodes of hematochezia associated with constipation or frequent stools. Labs done that day confirm hep C positive, genotype 3, CBC essentially normal with a hemoglobin of 15.0, CMP with elevated AST/ALT at 97/99 although improved from previous values of 149/135. Bilirubin, alkaline phosphatase, albumin all normal. Autoimmune labs normal, INR normal at 1.25. Iron was low at 25, iron saturation low at 7, ferritin normal at 137. Her mean labs normal. Ultrasound elastography completed 04/18/2016 found hepatic steatosis and gallbladder sludge. Fibrosis score F3 and some F4 denoted as high risk of fibrosis. A referral was made to [facility] liver clinic in [place] for consideration of treatment of hepatitis C given status with alcohol abuse and polysubstance abuse. [facility] does not take his insurance, subsequently referrals were made to tertiary academic centers with possible patient assistance.  Upper endoscopy for hematemesis completed 04/22/2016 which is noted as incomplete due to stomach full of food debris. Did note grade a esophagitis 36 cm from the incisors, no Barrett's seen, a good portion of the gastric mucosa visualized due  to food, patent pylorus, normal-appearing first and second portion of the duodenum. Recommended continue Protonix 40 mg daily and return to GI office in 3 weeks.  Today he states he's doing "ok" overall. Having panic attacks and he hasn't seen anyone about that. States he's still coughing up blood "a little bit, but not as bad." Has had several chest XRays that were normal. Occasional mild abdominal pain epigastric "or just a little bit below that" about 1-2 times a week, brief, burning sensation. Was taking Protonix but ran out. Also with occasional nausea, no vomiting. Denies hematochezia, melena. Denies fever, chills. Has had some subjective weight loss but has had a decreased appetite due to anxiety. Has lost 17 pounds objectively since last office visit here, but states he had put on significant weight over the past year and is wanting to lose the excess weight. Does have occasional chest pain, has had previous workup for cardiac etiology and he attributes them to anxiety. Denies dyspnea, dizziness, lightheadedness, syncope, near syncope. Denies any other upper or lower GI symptoms.  Past Medical History  Diagnosis Date  . Anxiety   . Substance abuse     S/P detox 03/18/16  . Heart palpitations   . Hypercholesteremia   . Obesity   . Positive hepatitis C antibody test     Past Surgical History  Procedure Laterality Date  . None to date    . Esophagogastroduodenoscopy (egd) with propofol N/A 04/22/2016    Procedure: ESOPHAGOGASTRODUODENOSCOPY (EGD) WITH PROPOFOL;  Surgeon: Corbin Ade, MD;  Location: [facility];  Service: Endoscopy;  Laterality: N/A;  0830    Current Outpatient Prescriptions  Medication  Sig Dispense Refill  . Doxylamine Succinate, Sleep, (SLEEP AID PO) Take 2 tablets by mouth daily as needed (sleep).    . methadone (METHADOSE) 40 MG disintegrating tablet Take 40 mg by mouth every 6 (six) hours as needed for withdrawal.    . pantoprazole (PROTONIX) 40 MG tablet Take 1  tablet (40 mg total) by mouth daily. 30 tablet 2  . ALPRAZolam (XANAX) 1 MG tablet Take 1 mg by mouth at bedtime as needed for anxiety. Reported on 05/13/2016    . [DISCONTINUED] gabapentin (NEURONTIN) 300 MG capsule Take 1 capsule (300 mg total) by mouth 3 (three) times daily. For Substance withdrawal syndrome (Patient not taking: Reported on 03/17/2015) 90 capsule 0  . [DISCONTINUED] QUEtiapine (SEROQUEL) 25 MG tablet Take 1 tablet (25 mg total) by mouth 3 (three) times daily. For mood control/agitation (Patient not taking: Reported on 03/17/2015) 90 tablet 0  . [DISCONTINUED] traZODone (DESYREL) 50 MG tablet Take 1 tablet (50 mg total) by mouth at bedtime and may repeat dose one time if needed. For sleep (Patient not taking: Reported on 03/17/2015) 60 tablet 0   No current facility-administered medications for this visit.    Allergies as of 05/13/2016 - Review Complete 05/13/2016  Allergen Reaction Noted  . Bee venom Anaphylaxis 06/01/2014    Family History  Problem Relation Age of Onset  . Anxiety disorder Mother   . Stroke Paternal Grandmother   . Cancer Paternal Grandmother   . Colon cancer Neg Hx     Social History   Social History  . Marital Status: Single    Spouse Name: N/A  . Number of Children: 2  . Years of Education: N/A   Occupational History  . unemployeed    Social History Main Topics  . Smoking status: Former Smoker -- 1.00 packs/day for 16 years    Quit date: 12/13/2015  . Smokeless tobacco: Never Used  . Alcohol Use: 0.0 oz/week    0 Standard drinks or equivalent per week     Comment: Quit ETOH 03/2016. Previously 24 oz liquor a day. Update: no ETOH currently (05/13/16)  . Drug Use: Yes    Special: Amphetamines, Marijuana, Cocaine, Heroin     Comment: Last heroin use 2-3 weeks ago. Not using anything else currently  . Sexual Activity: Yes   Other Topics Concern  . None   Social History Narrative    Review of Systems: General: Negative for anorexia, fever,  chills, fatigue, weakness. ENT: Negative for hoarseness, difficulty swallowing. CV: Negative for angina, palpitations, peripheral edema.  Respiratory: Negative for dyspnea at rest, cough, sputum, wheezing.  GI: See history of present illness. Psych: Admits chronic anxiety, possibly mild depression.  Endo: Some decrease in weight related to decreased appetite from anxiety.  Heme: Negative for bruising or bleeding. Allergy: Negative for rash or hives.   Physical Exam: BP 120/84 mmHg  Pulse 74  Temp(Src) 97.5 F (36.4 C)  Ht 5\' 10"  (1.778 m)  Wt 213 lb 6.4 oz (96.798 kg)  BMI 30.62 kg/m2 General:   Alert and oriented. Pleasant and cooperative. Well-nourished and well-developed.  Eyes:  Without icterus, sclera clear and conjunctiva pink.  Ears:  Normal auditory acuity. Cardiovascular:  S1, S2 present without murmurs appreciated. Extremities without clubbing or edema. Respiratory:  Clear to auscultation bilaterally. No wheezes, rales, or rhonchi. No distress.  Gastrointestinal:  +BS, obese but soft, non-tender and non-distended. No HSM noted. No guarding or rebound. No masses appreciated.  Rectal:  Deferred  Musculoskalatal:  Symmetrical without gross deformities. Neurologic:  Alert and oriented x4;  grossly normal neurologically. Psych:  Alert and cooperative. Normal mood and affect. Heme/Lymph/Immune: No excessive bruising noted.    05/13/2016 10:28 AM   Disclaimer: This note was dictated with voice recognition software. Similar sounding words can inadvertently be transcribed and may not be corrected upon review.

## 2016-05-14 ENCOUNTER — Telehealth: Payer: Self-pay

## 2016-05-14 LAB — AFP TUMOR MARKER: AFP-Tumor Marker: 2.6 ng/mL (ref 0.0–8.3)

## 2016-05-14 NOTE — Telephone Encounter (Signed)
Pt's mother is calling to check results of blood work

## 2016-05-14 NOTE — Assessment & Plan Note (Addendum)
Confirmed hepatitis C, genotype 3. Pending ID referral for treatment given polysubstance abuse and history of alcohol abuse. Recommend he continue weaning down and off of IV drug use. Currently seeing methadone clinic. Return for follow-up in 3 months.

## 2016-05-14 NOTE — Assessment & Plan Note (Signed)
Hepatic cirrhosis likely multifactorial in etiology. He is confirmed hepatitis C, history of alcoholism, IV drug use. Treatment as noted below. We will update labs today including CBC, CMP, PT/INR, AFP. Return for follow-up in 3 months.

## 2016-05-14 NOTE — Assessment & Plan Note (Deleted)
Conference cirrhosis with ultrasound elastography noted Matavir score of F3 and F4. Etiology likely a mix of hepatitis C, alcohol abuse, drug use. He has stopped drinking alcohol for about 4 months, is weaning down on IV drug use, is seeing methadone clinic to treat polysubstance abuse, pending infectious disease referral to consider treatment for hepatitis C. Today we're ordering routine labs including CBC, CMP, PTT/INR, AFP to establish a baseline for his liver disease. Return for follow-up in 3 months.

## 2016-05-14 NOTE — Assessment & Plan Note (Addendum)
3 polysubstance abuse. Currently sees methadone clinic in receiving methadone treatment, last use heroin about 3 weeks ago. States they're trying to transition him to Suboxone. Polysubstance abuse, [facility] treatment for hepatitis C, currently he [place] and it'll likely be one to 2 months for malignancy him to consider treatment. Return for follow-up in 3 months.  Committed stop all IV drugs and alcohol. It is notable that he is using less and less drugs and seems to be trying to wean down and off.

## 2016-05-14 NOTE — Assessment & Plan Note (Signed)
Previously seen noted hematemesis frequently. States this is decreased significantly. He has had an upper endoscopy which did not find any significant findings, although the exam was incomplete due to food in the stomach and unable to visualize entire gastric mucosa. I will discuss with Dr. Jena Gaussourk on whether he would like this repeated for definitive evaluation of gastric mucosa. For now, recommend avoid all NSAIDs, alcohol, IV drugs. We will recheck his labs as well. His last hemoglobin was stable/normal at 15. Return for follow-up in 3 months.

## 2016-05-14 NOTE — Progress Notes (Signed)
CC'ED TO PCP 

## 2016-05-14 NOTE — Assessment & Plan Note (Deleted)
Conference hepatitis C, referral pending to infectious disease for consideration of treatment given history of polysubstance abuse and current heroin use as well as methadone. Return for follow-up in 3 months.

## 2016-05-14 NOTE — Assessment & Plan Note (Signed)
History of alcohol dependence, has not had alcohol in about 4 months. Recommend continue to avoid all alcohol.

## 2016-05-14 NOTE — Assessment & Plan Note (Signed)
Admits epigastric pain which was doing well on Protonix, but he ran out. He is requesting refill sent. I send a refill of Protonix twice daily to the pharmacy. Return for follow-up in 3 months.

## 2016-05-15 NOTE — Telephone Encounter (Signed)
See result note.  

## 2016-05-15 NOTE — Progress Notes (Signed)
Quick Note:  Please tell the patient his AFP (tumor marker) was normal. CBC normal (no sign of infection/significant blood loss), kidney function normal. Liver enzymes mildly elevated, stable compared to last labwork and likely due to HCV infection. Liver function (INR, bili, albumin) look well-compensated/normal  MELD: 9 Child-Pugh: A ______

## 2016-05-23 ENCOUNTER — Encounter: Payer: Self-pay | Admitting: Internal Medicine

## 2016-05-31 ENCOUNTER — Telehealth: Payer: Self-pay | Admitting: Pharmacy Technician

## 2016-05-31 ENCOUNTER — Other Ambulatory Visit: Payer: No Typology Code available for payment source

## 2016-05-31 DIAGNOSIS — B182 Chronic viral hepatitis C: Secondary | ICD-10-CM

## 2016-05-31 LAB — HEPATITIS B CORE ANTIBODY, TOTAL: Hep B Core Total Ab: NONREACTIVE

## 2016-05-31 LAB — HEPATITIS B SURFACE ANTIGEN: HEP B S AG: NEGATIVE

## 2016-05-31 LAB — HIV ANTIBODY (ROUTINE TESTING W REFLEX): HIV 1&2 Ab, 4th Generation: NONREACTIVE

## 2016-05-31 LAB — HEPATITIS B SURFACE ANTIBODY,QUALITATIVE: HEP B S AB: NEGATIVE

## 2016-05-31 LAB — HEPATITIS A ANTIBODY, TOTAL: Hep A Total Ab: NONREACTIVE

## 2016-06-05 ENCOUNTER — Telehealth: Payer: Self-pay

## 2016-06-05 ENCOUNTER — Ambulatory Visit (INDEPENDENT_AMBULATORY_CARE_PROVIDER_SITE_OTHER): Payer: No Typology Code available for payment source | Admitting: Internal Medicine

## 2016-06-05 ENCOUNTER — Encounter: Payer: Self-pay | Admitting: Internal Medicine

## 2016-06-05 VITALS — BP 130/92 | HR 99 | Temp 98.1°F | Ht 70.0 in | Wt 214.1 lb

## 2016-06-05 DIAGNOSIS — Z23 Encounter for immunization: Secondary | ICD-10-CM

## 2016-06-05 DIAGNOSIS — B182 Chronic viral hepatitis C: Secondary | ICD-10-CM

## 2016-06-05 NOTE — Telephone Encounter (Signed)
Called patient to let him know that he does not need to have an elastography referral appointment due to him having had one on 04/18/2016. Patient is to still keep 07/31 appointment for 2nd Hep B injection and 08/28 appointment with Dr. Luciana Axeomer. Left message on voicemail for patient to return call to clinic. Rejeana Brockandace Ilyaas Musto, LPN

## 2016-06-05 NOTE — Addendum Note (Signed)
Addended by: Rejeana BrockMURRAY, Ariza Evans A on: 06/05/2016 05:35 PM   Modules accepted: Orders

## 2016-06-05 NOTE — Progress Notes (Signed)
[facility]   CC: consideration for treatment for chronic hepatitis C  HPI:  +Frederick Young is a 35 y.o. male who presents for initial evaluation and management of chronic hepatitis C.  Patient tested positive about 1 month ago. Hepatitis C-associated risk factors present are: IV drug abuse (details: last used about 1-2 weeks ago). Patient denies renal dialysis, sexual contact with person with liver disease. Patient has had other studies performed. Results: hepatitis C RNA by PCR, result: positive. Patient has not had prior treatment for Hepatitis C. Patient does not have a past history of liver disease. Patient does not have a family history of liver disease. Patient does not  have associated signs or symptoms related to liver disease.  Labs reviewed and confirm chronic hepatitis C with a positive viral load.   Records reviewed from PCP, GI.  He does have recent drug use and is open about his current use including heroin and alcohol abuse.  He has greatly cut down his alcohol use and last injection use over 1 week ago.  He feels he is on a good methadone dose now.  He struggles with anxiety and is going to get back in to mental health care and he understands he needs to be more stable to start hepatitis C medication.        Patient does not have documented immunity to Hepatitis A. Patient does not have documented immunity to Hepatitis B.    Review of Systems:   Constitutional: negative for fatigue and malaise Cardiovascular: negative for chest pain Musculoskeletal: negative for myalgias and arthralgias Behavioral/Psych: positive for anxiety and sleep disturbance, negative for anorexia All other systems reviewed and are negative      Past Medical History  Diagnosis Date  . Anxiety   . Substance abuse     S/P detox 03/18/16  . Heart palpitations   . Hypercholesteremia   . Obesity   . Positive hepatitis C antibody test     Prior to Admission  medications   Medication Sig Start Date End Date Taking? Authorizing Provider  methadone (METHADOSE) 40 MG disintegrating tablet Take 40 mg by mouth every 6 (six) hours as needed for withdrawal.   Yes Historical Provider, MD  pantoprazole (PROTONIX) 40 MG tablet Take 1 tablet (40 mg total) by mouth 2 (two) times daily before a meal. 05/13/16  Yes Anice PaganiniEric A Gill, NP  ALPRAZolam Prudy Feeler(XANAX) 1 MG tablet Take 1 mg by mouth at bedtime as needed for anxiety. Reported on 06/05/2016    Historical Provider, MD  Doxylamine Succinate, Sleep, (SLEEP AID PO) Take 2 tablets by mouth daily as needed (sleep). Reported on 06/05/2016    Historical Provider, MD    Allergies  Allergen Reactions  . Bee Venom Anaphylaxis    Social History  Substance Use Topics  . Smoking status: Former Smoker -- 1.00 packs/day for 16 years    Quit date: 12/13/2015  . Smokeless tobacco: Never Used  . Alcohol Use: 0.0 oz/week    0 Standard drinks or equivalent per week     Comment: Quit ETOH 03/2016. Previously 24 oz liquor a day. Update: no ETOH currently (05/13/16)    Family History  Problem Relation Age of Onset  . Anxiety disorder Mother   . Stroke Paternal Grandmother   . Cancer Paternal Grandmother   . Colon cancer Neg Hx      Objective:  Constitutional: in no apparent distress and alert,  Filed Vitals:   06/05/16  0916  BP: 130/92  Pulse: 99  Temp: 98.1 F (36.7 C)   Eyes: anicteric Cardiovascular: Cor RRR and No murmurs Respiratory: CTA B; normal respiratory effort Gastrointestinal: Bowel sounds are normal, liver is not enlarged, spleen is not enlarged Musculoskeletal: no pedal edema noted Skin: negatives: no rash; no porphyria cutanea tarda Lymphatic: no cervical lymphadenopathy   Laboratory Genotype: No results found for: HCVGENOTYPE HCV viral load:  Lab Results  Component Value Date   HCVQUANT CANCELED 03/13/2016   Lab Results  Component Value Date   WBC 5.5 05/13/2016   HGB 15.5 05/13/2016   HCT 48.0  05/13/2016   MCV 95.2 05/13/2016   PLT 221 05/13/2016    Lab Results  Component Value Date   CREATININE 1.02 05/13/2016   BUN 10 05/13/2016   NA 136 05/13/2016   K 3.9 05/13/2016   CL 102 05/13/2016   CO2 28 05/13/2016    Lab Results  Component Value Date   ALT 95* 05/13/2016   AST 87* 05/13/2016   ALKPHOS 64 05/13/2016     Labs and history reviewed and show CHILD-PUGH A  5-6 points: Child class A 7-9 points: Child class B 10-15 points: Child class C  Lab Results  Component Value Date   INR 1.21 05/13/2016   BILITOT 0.6 05/13/2016   ALBUMIN 4.1 05/13/2016     Assessment: New Patient with Chronic Hepatitis C genotype 3, untreated.  I discussed with the patient the lab findings that confirm chronic hepatitis C as well as the natural history and progression of disease including about 30% of people who develop cirrhosis of the liver if left untreated and once cirrhosis is established there is a 2-7% risk per year of liver cancer and liver failure.  I discussed the importance of treatment and benefits in reducing the risk, even if significant liver fibrosis exists.  At this time, he will need to remain drug free and get into good mental health care.  Additionally, it is possible that he has acute hepatitis C with recent elevated LFTs that have trended down as he was recently using.  I explained that in 20% of people, this may clear on its own within the first year.   Plan: 1) Patient counseled extensively on limiting acetaminophen to no more than 2 grams daily, avoidance of alcohol. 2) Transmission discussed with patient including sexual transmission, sharing razors and toothbrush.   3) is already seeing GI and had EGD.   4) Will need referral for substance abuse counseling: Yes.  ; Further work up to include urine drug screen  No. 5) Will consider Epclusa via [facility] at his next visit if he is in counseling and remains drug free.  Will need to separate PPI or  hold it if possible.   6) Hepatitis A vaccine Yes.   7) Hepatitis B vaccine Yes.   8) Pneumovax vaccine next visit 9) will follow up in 2 months and I will recheck the virus, consider treatment. 10) elastography done and c/w F3/4.

## 2016-06-10 LAB — HCV RNA, QUANT REAL-TIME PCR W/REFLEX
HCV RNA, PCR, QN (Log): 7.42 LogIU/mL — ABNORMAL HIGH
HCV RNA, PCR, QN: 26600000 [IU]/mL — AB

## 2016-06-10 LAB — HCV RNA,LIPA RFLX NS5A DRUG RESIST

## 2016-06-19 ENCOUNTER — Ambulatory Visit: Payer: Self-pay | Admitting: Nurse Practitioner

## 2016-07-08 ENCOUNTER — Ambulatory Visit (INDEPENDENT_AMBULATORY_CARE_PROVIDER_SITE_OTHER): Payer: No Typology Code available for payment source | Admitting: *Deleted

## 2016-07-08 DIAGNOSIS — Z23 Encounter for immunization: Secondary | ICD-10-CM

## 2016-07-08 NOTE — Patient Instructions (Signed)
Please return in 4 months for #3 Hep B and #2 Hep A vaccinations.  Thank you for coming to the Center for your care.  Angelique Blonder, RN

## 2016-07-15 ENCOUNTER — Ambulatory Visit: Payer: Self-pay | Admitting: Family Medicine

## 2016-08-05 ENCOUNTER — Ambulatory Visit (INDEPENDENT_AMBULATORY_CARE_PROVIDER_SITE_OTHER): Payer: No Typology Code available for payment source | Admitting: Internal Medicine

## 2016-08-05 DIAGNOSIS — B182 Chronic viral hepatitis C: Secondary | ICD-10-CM

## 2016-08-05 DIAGNOSIS — K7460 Unspecified cirrhosis of liver: Secondary | ICD-10-CM

## 2016-08-05 NOTE — Assessment & Plan Note (Signed)
Will refer him to GI next visit.  Will need pneumovax.

## 2016-08-05 NOTE — Assessment & Plan Note (Signed)
Not stable living environment at this time.  Will defer treatment for now but consider in 2 months.  Will do labs again then.

## 2016-08-05 NOTE — Progress Notes (Signed)
   Subjective:    Patient ID: Frederick NeighboursChristopher J Young, male    DOB: 1981/08/16, 35 y.o.   MRN: [id number]  HPI  Here for follow up of hepatitis C  Has genotype 3a, elastography with F3/4 and recently stopped IV drugs.  Not in any counseling program now but does not have heroin cravings or withdrawal.  Does not currently have stable housing and continues to drink but much improved.    Review of Systems  Constitutional: Negative for fatigue.  Neurological: Negative for headaches.       Objective:   Physical Exam  Constitutional: He appears well-developed and well-nourished.  Eyes: No scleral icterus.  Cardiovascular: Normal rate, regular rhythm and normal heart sounds.   Skin: No rash noted.          Assessment & Plan:

## 2016-08-20 ENCOUNTER — Ambulatory Visit: Payer: Self-pay | Admitting: Nurse Practitioner

## 2016-09-16 ENCOUNTER — Ambulatory Visit: Payer: Self-pay | Admitting: Nurse Practitioner

## 2016-10-07 ENCOUNTER — Ambulatory Visit: Payer: Self-pay | Admitting: Internal Medicine

## 2016-10-18 ENCOUNTER — Encounter (HOSPITAL_COMMUNITY): Payer: Self-pay | Admitting: Emergency Medicine

## 2016-10-18 ENCOUNTER — Ambulatory Visit (HOSPITAL_COMMUNITY)
Admission: EM | Admit: 2016-10-18 | Discharge: 2016-10-18 | Disposition: A | Discharge status: Home / Self Care | Payer: No Typology Code available for payment source | Attending: Emergency Medicine | Admitting: Emergency Medicine

## 2016-10-18 DIAGNOSIS — B029 Zoster without complications: Secondary | ICD-10-CM

## 2016-10-18 MED ORDER — HYDROCODONE-ACETAMINOPHEN 5-325 MG PO TABS
2.0000 | ORAL_TABLET | Freq: Once | ORAL | Status: AC
  Administered 2016-10-18: 2 via ORAL

## 2016-10-18 MED ORDER — VALACYCLOVIR HCL 1 G PO TABS: 1000.0000 mg | ORAL_TABLET | Freq: Three times a day (TID) | ORAL | 0 refills | Status: DC

## 2016-10-18 MED ORDER — TRIAMCINOLONE ACETONIDE 0.1 % EX CREA: 1.0000 "application " | TOPICAL_CREAM | Freq: Two times a day (BID) | CUTANEOUS | 0 refills | Status: DC

## 2016-10-18 MED ORDER — HYDROCODONE-ACETAMINOPHEN 5-325 MG PO TABS: 1.0000 | ORAL_TABLET | Freq: Four times a day (QID) | ORAL | 0 refills | Status: DC | PRN

## 2016-10-18 MED ORDER — HYDROCODONE-ACETAMINOPHEN 5-325 MG PO TABS
ORAL_TABLET | ORAL | Status: AC
  Filled 2016-10-18: qty 1

## 2016-10-18 NOTE — ED Provider Notes (Signed)
CSN: [id number]     Arrival date & time 10/18/16  1057 History   None    Chief Complaint  Patient presents with  . Rash   (Consider location/radiation/quality/duration/timing/severity/associated sxs/prior Treatment) Pt reports onset of diffuse pain to (L) upper thigh last Sunday. By Tuesday a rash appeared that has been both very painful and pruritic at times. He attempted to use an OTC analgesia cream but got no relief. Denies unusual exposure to plants, new clothing or meds/chemicals. No fever. No h/o shingles or other forms of herpes virus.      Past Medical History:  Diagnosis Date  . Anxiety   . Heart palpitations   . Hypercholesteremia   . Obesity   . Positive hepatitis C antibody test   . Substance abuse    S/P detox 03/18/16   Past Surgical History:  Procedure Laterality Date  . ESOPHAGOGASTRODUODENOSCOPY (EGD) WITH PROPOFOL N/A 04/22/2016   Procedure: ESOPHAGOGASTRODUODENOSCOPY (EGD) WITH PROPOFOL;  Surgeon: Corbin Adeobert M Rourk, MD;  Location: [facility];  Service: Endoscopy;  Laterality: N/A;  0830  . None to date     Family History  Problem Relation Age of Onset  . Anxiety disorder Mother   . Stroke Paternal Grandmother   . Cancer Paternal Grandmother   . Colon cancer Neg Hx    Social History  Substance Use Topics  . Smoking status: Current Every Day Smoker    Packs/day: 0.50    Years: 16.00    Types: Cigars    Last attempt to quit: 12/13/2015  . Smokeless tobacco: Never Used  . Alcohol use 0.0 oz/week     Comment: Quit ETOH 03/2016. Previously 24 oz liquor a day. Update: no ETOH currently (05/13/16)    Review of Systems  All other systems reviewed and are negative.   Allergies  Bee venom  Home Medications   Prior to Admission medications   Medication Sig Start Date End Date Taking? Authorizing Provider  Doxylamine Succinate, Sleep, (SLEEP AID PO) Take 2 tablets by mouth daily as needed (sleep). Reported on 06/05/2016    Historical Provider, MD   HYDROcodone-acetaminophen (NORCO/VICODIN) 5-325 MG tablet Take 1-2 tablets by mouth every 6 (six) hours as needed for moderate pain or severe pain. 10/18/16   Roma KayserKatherine P Renelle Stegenga, NP  pantoprazole (PROTONIX) 40 MG tablet Take 1 tablet (40 mg total) by mouth 2 (two) times daily before a meal. Patient not taking: Reported on 10/18/2016 05/13/16   Anice PaganiniEric A Gill, NP  triamcinolone cream (KENALOG) 0.1 % Apply 1 application topically 2 (two) times daily. 10/18/16   Roma KayserKatherine P Ixchel Duck, NP  valACYclovir (VALTREX) 1000 MG tablet Take 1 tablet (1,000 mg total) by mouth 3 (three) times daily. 10/18/16   Leanne ChangKatherine P Chloee Tena, NP   Meds Ordered and Administered this Visit   Medications  HYDROcodone-acetaminophen (NORCO/VICODIN) 5-325 MG per tablet 2 tablet (2 tablets Oral Given 10/18/16 1155)    BP 129/93 (BP Location: Left Arm)   Pulse 80   Temp 98.2 F (36.8 C) (Oral)   Resp 18   SpO2 96%  No data found.   Physical Exam  Constitutional: He is oriented to person, place, and time. He appears well-developed and well-nourished.  HENT:  Head: Normocephalic and atraumatic.  Eyes: Conjunctivae are normal.  Cardiovascular: Normal rate.   Pulmonary/Chest: Effort normal.  Neurological: He is alert and oriented to person, place, and time.  Skin: Skin is warm and dry. Rash noted. There is erythema.  Extremely erythematous papular rash to  lateral and medial LUE. Papules are clustered in consolidated patches surrounded by erythematous tissue. Some blanching. Some patches appear to be what could have initially been vesicular in nature. No active oozing or drainage. Rash is TTP.   Psychiatric: He has a normal mood and affect.  Nursing note and vitals reviewed.   Urgent Care Course   Clinical Course     Procedures (including critical care time)  Labs Review Labs Reviewed - No data to display  Imaging Review No results found.   Visual Acuity Review  Right Eye Distance:   Left Eye Distance:    Bilateral Distance:    Right Eye Near:   Left Eye Near:    Bilateral Near:         MDM   1. Thigh shingles   Atypical painful rash to (L) upper thigh. Some characteristics of shingles. Given painful prodrome and current rash will treat for shingles. Valtrex 1000 mg TID x 7 days Triamcinolone cream 0.1% BID Norco 1-2 q6h PRN #15 Home care discussed and provided in print.      Leanne ChangKatherine P Tae Robak, NP 10/18/16 1216    Leanne ChangKatherine P Haunani Dickard, NP 10/18/16 [phone]

## 2016-10-18 NOTE — ED Triage Notes (Signed)
Here for rash on left leg onset x3 days... Reports it first started out w/mild pain onset 6 days ago and then the rash appeared.   Denies fevers, chills  A&O x4... NAD

## 2016-11-07 ENCOUNTER — Ambulatory Visit: Payer: Self-pay | Admitting: Infectious Diseases

## 2016-11-07 ENCOUNTER — Ambulatory Visit: Payer: Self-pay | Admitting: Internal Medicine

## 2016-11-07 ENCOUNTER — Ambulatory Visit: Payer: Self-pay

## 2016-12-05 ENCOUNTER — Ambulatory Visit: Payer: Self-pay

## 2016-12-10 ENCOUNTER — Ambulatory Visit (INDEPENDENT_AMBULATORY_CARE_PROVIDER_SITE_OTHER): Payer: No Typology Code available for payment source | Admitting: Internal Medicine

## 2016-12-10 ENCOUNTER — Encounter: Payer: Self-pay | Admitting: Internal Medicine

## 2016-12-10 ENCOUNTER — Other Ambulatory Visit: Payer: Self-pay | Admitting: Internal Medicine

## 2016-12-10 VITALS — BP 149/103 | HR 96 | Temp 98.3°F | Ht 69.5 in | Wt 215.0 lb

## 2016-12-10 DIAGNOSIS — Z23 Encounter for immunization: Secondary | ICD-10-CM

## 2016-12-10 DIAGNOSIS — B182 Chronic viral hepatitis C: Secondary | ICD-10-CM

## 2016-12-10 DIAGNOSIS — F1994 Other psychoactive substance use, unspecified with psychoactive substance-induced mood disorder: Secondary | ICD-10-CM

## 2016-12-10 DIAGNOSIS — K7460 Unspecified cirrhosis of liver: Secondary | ICD-10-CM

## 2016-12-10 MED ORDER — SOFOSBUVIR-VELPATASVIR 400-100 MG PO TABS: 1.0000 | ORAL_TABLET | Freq: Every day | ORAL | 2 refills | Status: DC

## 2016-12-10 NOTE — Patient Instructions (Signed)
Date 12/10/16  Dear Mr. Daphine DeutscherMartin, As discussed in the ID Clinic, your hepatitis C therapy will include the following medications:    sofosbuvir 400 mg/velpatasvir 100 mg (Epclusa) oral daily  Please note that ALL MEDICATIONS WILL START ON THE SAME DATE for a total of 12 weeks. ---------------------------------------------------------------- Your HCV Treatment Start Date: TBA   Your HCV genotype: 3    Liver Fibrosis: cirrhosis   ---------------------------------------------------------------- YOUR PHARMACY CONTACT (depending on your insurance):   [facility] [address] Phone: [phone] Hours: Monday to Friday 7:30 am to 6:00 pm   Please always contact your pharmacy at least 3-4 business days before you run out of medications to ensure your next month's medication is ready or 1 week prior to running out if you receive it by mail.  Remember, each prescription is for 28 days. ---------------------------------------------------------------- GENERAL NOTES REGARDING YOUR HEPATITIS C MEDICATION:  SOFOSBUVIR (SOVALDI or EPCLUSA): - Sofosbuvir 400 mg tablet is taken daily with OR without food. - The sofosbuvir tablets are yellow. - The Epclusa tablets are pink, diamond-shaped - The tablets should be stored at room temperature. - The most common side effects with sofosbuvir or Epclusa include:      1. Fatigue      2. Headache      3. Nausea      4. Diarrhea      5. Insomnia  - Acid reducing agents such as H2 blockers (ie. Pepcid (famotidine), Zantac (ranitidine), Tagamet (cimetidine), Axid (nizatidine) and proton pump inhibitors (ie. Prilosec (omeprazole), Protonix (pantoprazole), Nexium (esomeprazole), or Aciphex (rabeprazole)) can decrease effectiveness of Harvoni. Do not take until you have discussed with a health care provider.    -Antacids that contain magnesium and/or aluminum hydroxide (ie. Milk of Magensia,  Rolaids, Gaviscon, Maalox, Mylanta, an dArthritis Pain Formula)can reduce absorption of sofosbuvir, so take them at least 4 hours before or after Harvoni.  -Calcium carbonate (calcium supplements or antacids such as Tums, Caltrate, Os-Cal)needs to be taken at least 4 hours hours before or after sofosbuvir.  -St. John's wort or any products that contain St. John's wort like some herbal supplements  Please inform the office prior to starting any of these medications.   Please note that this only lists the most common side effects and is NOT a comprehensive list of the potential side effects of these medications. For more information, please review the drug information sheets that come with your medication package from the pharmacy.  ---------------------------------------------------------------- GENERAL HELPFUL HINTS ON HCV THERAPY: 1. No alcohol. 2. Stay well-hydrated 3. Notify the ID Clinic of any changes in your other over-the-counter/herbal or prescription medications. 4. If you miss a dose of your medication, take the missed dose as soon as you remember. Return to your regular time/dose schedule the next day.  5.  Do not stop taking your medications without first talking with your healthcare provider. 6.  You may take Tylenol (acetaminophen), as long as the dose is less than 2000 mg (OR no more than 4 tablets of the Tylenol Extra Strengths 500mg  tablet) in 24 hours. 7. You will follow up with our clinic pharmacist initially after starting the medication to monitor for any possible side effects 8. You will get labs once during treatment, soon after treatment completion and again 6 months or more after treatment completion to verify the virus is completely gone.   Staci RighterOMER, Atticus Lemberger, MD  [facility]  [address] [place], [place]  [address] [phone]

## 2016-12-10 NOTE — Progress Notes (Signed)
   Subjective:    Patient ID: Frederick Young, male    DOB: 12/16/1980, 36 y.o.   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  I previously saw him and he was homeless, still using IV heroin, alcohol.  He has significant issues with anxiety, sleep issues.  Has been drug free now 1 month and feels he is in a good place to start HCV treatment.      Review of Systems  Constitutional: Negative for fatigue.  Skin: Negative for rash.  Neurological: Negative for headaches.  Psychiatric/Behavioral: Positive for sleep disturbance. Negative for suicidal ideas. The patient is nervous/anxious.        Objective:   Physical Exam  Constitutional: He appears well-developed and well-nourished.  Eyes: No scleral icterus.  Cardiovascular: Normal rate, regular rhythm and normal heart sounds.   Skin: No rash noted.   SHX: drug and alcohol free at this time.       Assessment & Plan:

## 2016-12-10 NOTE — Assessment & Plan Note (Addendum)
Will need continued hcc screening.  Will do limited abdominal ultrasound next visit with PharmD and can do every 6 months.  Pneumovax today hepatitis A and B final today

## 2016-12-10 NOTE — Assessment & Plan Note (Addendum)
I will start the process for Epclusa 12 weeks via support path.  Will update labs today to confirm still active.   He understands that he can get reinfected with drug use and knows he has one opportunity for treatment.  He voiced his understanding and he feels he is in a good place to start now.   He will follow up with PharmD for treatment.

## 2016-12-10 NOTE — Assessment & Plan Note (Signed)
He feels he is in a better place now and ready for treatment for hepatitis C.

## 2016-12-11 LAB — COMPLETE METABOLIC PANEL WITH GFR
ALBUMIN: 4.1 g/dL (ref 3.6–5.1)
ALK PHOS: 112 U/L (ref 40–115)
ALT: 253 U/L — AB (ref 9–46)
AST: 159 U/L — ABNORMAL HIGH (ref 10–40)
BILIRUBIN TOTAL: 0.8 mg/dL (ref 0.2–1.2)
BUN: 8 mg/dL (ref 7–25)
CO2: 29 mmol/L (ref 20–31)
Calcium: 9.5 mg/dL (ref 8.6–10.3)
Chloride: 101 mmol/L (ref 98–110)
Creat: 0.95 mg/dL (ref 0.60–1.35)
Glucose, Bld: 106 mg/dL — ABNORMAL HIGH (ref 65–99)
Potassium: 4.1 mmol/L (ref 3.5–5.3)
Sodium: 137 mmol/L (ref 135–146)
TOTAL PROTEIN: 7.9 g/dL (ref 6.1–8.1)

## 2016-12-13 LAB — HCV RNA, QN PCR RFLX GENO, LIPA
HCV RNA, PCR, QN: 1902914 [IU]/mL — AB (ref <15)
HCV RNA, PCR, QN: 6.28 log IU/mL — ABNORMAL HIGH (ref <1.18)

## 2016-12-13 LAB — HEPATITIS C GENOTYPE

## 2017-01-21 NOTE — Telephone Encounter (Signed)
l °

## 2017-01-23 ENCOUNTER — Telehealth: Payer: Self-pay | Admitting: Pharmacy Technician

## 2017-01-23 NOTE — Telephone Encounter (Signed)
Need financial info. to file with support path for Epclusa.  Left 3 VM messages

## 2017-02-19 ENCOUNTER — Encounter (HOSPITAL_BASED_OUTPATIENT_CLINIC_OR_DEPARTMENT_OTHER): Payer: Self-pay | Admitting: Emergency Medicine

## 2017-02-19 ENCOUNTER — Emergency Department (HOSPITAL_BASED_OUTPATIENT_CLINIC_OR_DEPARTMENT_OTHER)
Admission: EM | Admit: 2017-02-19 | Discharge: 2017-02-19 | Disposition: A | Discharge status: Home / Self Care | Payer: No Typology Code available for payment source | Attending: Emergency Medicine | Admitting: Emergency Medicine

## 2017-02-19 DIAGNOSIS — Y939 Activity, unspecified: Secondary | ICD-10-CM | POA: Insufficient documentation

## 2017-02-19 DIAGNOSIS — F17290 Nicotine dependence, other tobacco product, uncomplicated: Secondary | ICD-10-CM | POA: Insufficient documentation

## 2017-02-19 DIAGNOSIS — B9689 Other specified bacterial agents as the cause of diseases classified elsewhere: Secondary | ICD-10-CM

## 2017-02-19 DIAGNOSIS — M71161 Other infective bursitis, right knee: Secondary | ICD-10-CM

## 2017-02-19 DIAGNOSIS — M7051 Other bursitis of knee, right knee: Secondary | ICD-10-CM | POA: Insufficient documentation

## 2017-02-19 MED ORDER — DOXYCYCLINE HYCLATE 100 MG PO CAPS: 100.0000 mg | ORAL_CAPSULE | Freq: Two times a day (BID) | ORAL | 0 refills | Status: DC

## 2017-02-19 MED ORDER — NAPROXEN 500 MG PO TABS: 500.0000 mg | ORAL_TABLET | Freq: Two times a day (BID) | ORAL | 0 refills | Status: DC

## 2017-02-19 NOTE — Discharge Instructions (Signed)
Take antibiotics as prescribed until all gone. Ibuprofen for pain. Return if not improving.

## 2017-02-19 NOTE — ED Provider Notes (Signed)
[facility]   CSN: [id number] Arrival date & time: 02/19/17  1322     History   Chief Complaint Chief Complaint  Patient presents with  . Knott on knee    HPI Frederick Young is a 36 y.o. male.  HPI Frederick NeighboursChristopher J Young is a 36 y.o. male with history of hepatitis C, presents to emergency department complaining of swelling to the right lower leg, just below the knee. Patient states he developed a small swollen area just below the knee on the right leg about a week ago. Gradually has been getting worse. He has been keeping his leg elevated. He has applied ice pack with no relief of his symptoms. He denies any injuries. Denies any falls or hitting his leg on anything. He denies history of the same. No fever or chills. Pain with movement of the knee. States it feels warm and hot.  Past Medical History:  Diagnosis Date  . Anxiety   . Heart palpitations   . Hypercholesteremia   . Obesity   . Positive hepatitis C antibody test   . Substance abuse    S/P detox 03/18/16    Patient Active Problem List   Diagnosis Date Noted  . Chronic hepatitis C (HCC) 05/13/2016  . Hepatic cirrhosis (HCC) 05/13/2016  . Reflux esophagitis   . Hepatitis C antibody test positive 04/11/2016  . Abnormal CT of the abdomen 04/11/2016  . Polysubstance abuse 04/11/2016  . Hematemesis 04/11/2016  . Atypical chest pain 03/14/2016  . Hypokalemia 03/14/2016  . Weight gain 03/14/2016  . Essential tremor 03/14/2016  . Substance induced mood disorder (HCC) 06/01/2014  . Opioid dependence (HCC) 06/01/2014  . Alcohol dependence (HCC) 06/01/2014  . Benzodiazepine dependence (HCC) 06/01/2014  . 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 NICOTINE ADDICTION 11/10/2007  . PANIC DISORDER 11/06/2007  . ANXIETY STATE, UNSPECIFIED 02/12/2007    Past Surgical History:  Procedure Laterality Date  . ESOPHAGOGASTRODUODENOSCOPY (EGD) WITH PROPOFOL N/A 04/22/2016   Procedure:  ESOPHAGOGASTRODUODENOSCOPY (EGD) WITH PROPOFOL;  Surgeon: Corbin Adeobert M Rourk, MD;  [facility];  Service: Endoscopy;  Laterality: N/A;  0830  . None to date         Home Medications    Prior to Admission medications   Medication Sig Start Date End Date Taking? Authorizing Provider  Doxylamine Succinate, Sleep, (SLEEP AID PO) Take 2 tablets by mouth daily as needed (sleep). Reported on 06/05/2016    Historical Provider, MD  Sofosbuvir-Velpatasvir (EPCLUSA) 400-100 MG TABS Take 1 tablet by mouth daily. 12/10/16   Gardiner Barefootobert W Comer, MD    Family History Family History  Problem Relation Age of Onset  . Anxiety disorder Mother   . Stroke Paternal Grandmother   . Cancer Paternal Grandmother   . Colon cancer Neg Hx     Social History Social History  Substance Use Topics  . Smoking status: Current Every Day Smoker    Packs/day: 0.50    Years: 16.00    Types: Cigars    Last attempt to quit: 12/13/2015  . Smokeless tobacco: Never Used  . Alcohol use 0.0 oz/week     Allergies   Bee venom   Review of Systems Review of Systems  Constitutional: Negative for chills and fever.  Respiratory: Negative for cough, chest tightness and shortness of breath.   Cardiovascular: Negative for chest pain, palpitations and leg swelling.  Gastrointestinal: Negative for abdominal distention, abdominal pain, diarrhea, nausea and vomiting.  Musculoskeletal: Positive for arthralgias and joint  swelling. Negative for myalgias, neck pain and neck stiffness.  Skin: Negative for rash.  Allergic/Immunologic: Negative for immunocompromised state.  Neurological: Negative for dizziness, weakness, light-headedness, numbness and headaches.  All other systems reviewed and are negative.    Physical Exam Updated Vital Signs BP (!) 139/102   Pulse 116   Temp 98.9 F (37.2 C) (Oral)   Resp 18   SpO2 99%   Physical Exam  Constitutional: He appears well-developed and well-nourished. No distress.  HENT:    Head: Normocephalic and atraumatic.  Eyes: Conjunctivae are normal.  Neck: Neck supple.  Cardiovascular: Normal rate, regular rhythm and normal heart sounds.   Pulmonary/Chest: Effort normal. No respiratory distress. He has no wheezes. He has no rales.  Abdominal: Soft. Bowel sounds are normal. He exhibits no distension. There is no tenderness. There is no rebound.  Musculoskeletal: He exhibits no edema.       Legs: About 3x3cm area of erythema, warmth, ttp over tibial tuberosity of the right knee. Pain with knee flexion and active extension. Full ROM of the knee joint. Joint is not erythematous, warm, swollen. DP pulses intact and equal biaterally.   Neurological: He is alert.  Skin: Skin is warm and dry.  Nursing note and vitals reviewed.    ED Treatments / Results  Labs (all labs ordered are listed, but only abnormal results are displayed) Labs Reviewed - No data to display  EKG  EKG Interpretation None       Radiology No results found.  Procedures Procedures (including critical care time)  Medications Ordered in ED Medications - No data to display   Initial Impression / Assessment and Plan / ED Course  I have reviewed the triage vital signs and the nursing notes.  Pertinent labs & imaging results that were available during my care of the patient were reviewed by me and considered in my medical decision making (see chart for details).     Patient with what appears to be possible infectious bursitis to the prepatellar bursa. There is minimal swelling and warmth to the touch. There is definitely no any signs of infection to the knee joint. Patient admitted to IV drug use, however denies injecting near that area. He is afebrile he is tachycardic, however normal blood pressure. There is no concern for sepsis at this time. He has no other systemic symptoms. We will discharge him home with antibiotics. Advised to return if any worsening symptoms. Patient voiced  understanding. Will start on doxycycline.  Vitals:   02/19/17 1327 02/19/17 1457  BP: (!) 139/102 129/85  Pulse: 116 112  Resp: 18 18  Temp: 98.9 F (37.2 C) 98.4 F (36.9 C)  TempSrc: Oral Oral  SpO2: 99% 91%     Final Clinical Impressions(s) / ED Diagnoses   Final diagnoses:  Septic infrapatellar bursitis of right knee    New Prescriptions Discharge Medication List as of 02/19/2017  2:51 PM    START taking these medications   Details  doxycycline (VIBRAMYCIN) 100 MG capsule Take 1 capsule (100 mg total) by mouth 2 (two) times daily., Starting Wed 02/19/2017, Print    naproxen (NAPROSYN) 500 MG tablet Take 1 tablet (500 mg total) by mouth 2 (two) times daily., Starting Wed 02/19/2017, Print         Jaynie Crumble, PA-C 02/19/17 1633    Pricilla Loveless, MD 02/20/17 [phone]

## 2017-02-19 NOTE — ED Triage Notes (Signed)
Knott on right knee for about 1 1/2 weeks

## 2017-03-05 ENCOUNTER — Emergency Department (HOSPITAL_COMMUNITY)
Admission: EM | Admit: 2017-03-05 | Discharge: 2017-03-06 | Disposition: A | Discharge status: Home / Self Care | Payer: No Typology Code available for payment source | Attending: Emergency Medicine | Admitting: Emergency Medicine

## 2017-03-05 ENCOUNTER — Encounter (HOSPITAL_COMMUNITY): Payer: Self-pay | Admitting: Emergency Medicine

## 2017-03-05 ENCOUNTER — Emergency Department (HOSPITAL_COMMUNITY): Payer: No Typology Code available for payment source

## 2017-03-05 DIAGNOSIS — Z79899 Other long term (current) drug therapy: Secondary | ICD-10-CM | POA: Insufficient documentation

## 2017-03-05 DIAGNOSIS — F17290 Nicotine dependence, other tobacco product, uncomplicated: Secondary | ICD-10-CM | POA: Insufficient documentation

## 2017-03-05 DIAGNOSIS — X501XXA Overexertion from prolonged static or awkward postures, initial encounter: Secondary | ICD-10-CM | POA: Insufficient documentation

## 2017-03-05 DIAGNOSIS — Y9302 Activity, running: Secondary | ICD-10-CM | POA: Insufficient documentation

## 2017-03-05 DIAGNOSIS — L03115 Cellulitis of right lower limb: Secondary | ICD-10-CM

## 2017-03-05 DIAGNOSIS — Y999 Unspecified external cause status: Secondary | ICD-10-CM | POA: Insufficient documentation

## 2017-03-05 DIAGNOSIS — S8391XA Sprain of unspecified site of right knee, initial encounter: Secondary | ICD-10-CM | POA: Insufficient documentation

## 2017-03-05 DIAGNOSIS — Y929 Unspecified place or not applicable: Secondary | ICD-10-CM | POA: Insufficient documentation

## 2017-03-05 MED ORDER — OXYCODONE-ACETAMINOPHEN 5-325 MG PO TABS
1.0000 | ORAL_TABLET | Freq: Once | ORAL | Status: AC
  Administered 2017-03-05: 1 via ORAL
  Filled 2017-03-05: qty 1

## 2017-03-05 NOTE — ED Provider Notes (Signed)
[facility]-EMERGENCY DEPT Provider Note   CSN: [id number] Arrival date & time: 03/05/17  1937  By signing my name below, I, Modena Jansky, attest that this documentation has been prepared under the direction and in the presence of non-physician practitioner, Kerrie Buffalo, NP. Electronically Signed: Modena Jansky, Scribe. 03/05/2017. 10:07 PM.  History   Chief Complaint Chief Complaint  Patient presents with  . Knee Pain   The history is provided by the patient. No language interpreter was used.  Leg Pain   This is a new problem. The current episode started yesterday. The problem occurs constantly. The problem has not changed since onset.The pain is present in the right upper leg, right knee and right lower leg. The pain is moderate. He has tried nothing for the symptoms.   HPI Comments: Frederick Young is a 36 y.o. male with multiple chronic medical problems who presents to the Emergency Department complaining of constant moderate RLE pain that started yesterday.  He states yesterday he was running down a ramp when his right knee twisted and he felt severe pain. He reports that since the incident his right knee pain radiates to the right lower leg. No treatment PTA. He has associated redness and swelling to his right knee and lower leg. Patient reports being evaluated 2 weeks ago for knee pain and redness to his lower right leg and was treated for cellulitis with Doxycycline. Patient continues to have redness from the knee to the lower leg. He denies any other complaints. Patient admits to being an IV drug user and reports that he last used heroin one month ago.   Past Medical History:  Diagnosis Date  . Anxiety   . Heart palpitations   . Hypercholesteremia   . Obesity   . Positive hepatitis C antibody test   . Substance abuse    S/P detox 03/18/16    Patient Active Problem List   Diagnosis Date Noted  . Chronic hepatitis C (HCC) 05/13/2016  . Hepatic cirrhosis (HCC) 05/13/2016  .  Reflux esophagitis   . Hepatitis C antibody test positive 04/11/2016  . Abnormal CT of the abdomen 04/11/2016  . Polysubstance abuse 04/11/2016  . Hematemesis 04/11/2016  . Atypical chest pain 03/14/2016  . Hypokalemia 03/14/2016  . Weight gain 03/14/2016  . Essential tremor 03/14/2016  . Substance induced mood disorder (HCC) 06/01/2014  . Opioid dependence (HCC) 06/01/2014  . Alcohol dependence (HCC) 06/01/2014  . Benzodiazepine dependence (HCC) 06/01/2014  . 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 NICOTINE ADDICTION 11/10/2007  . PANIC DISORDER 11/06/2007  . ANXIETY STATE, UNSPECIFIED 02/12/2007    Past Surgical History:  Procedure Laterality Date  . ESOPHAGOGASTRODUODENOSCOPY (EGD) WITH PROPOFOL N/A 04/22/2016   Procedure: ESOPHAGOGASTRODUODENOSCOPY (EGD) WITH PROPOFOL;  Surgeon: Corbin Ade, MD;  Location: [facility];  Service: Endoscopy;  Laterality: N/A;  0830  . None to date         Home Medications    Prior to Admission medications   Medication Sig Start Date End Date Taking? Authorizing Provider  clindamycin (CLEOCIN) 300 MG capsule Take 1 capsule (300 mg total) by mouth 3 (three) times daily. 03/06/17   Shaliyah Taite Orlene Och, NP  diclofenac (VOLTAREN) 50 MG EC tablet Take 1 tablet (50 mg total) by mouth 2 (two) times daily. 03/06/17   Shivani Barrantes Orlene Och, NP  doxycycline (VIBRAMYCIN) 100 MG capsule Take 1 capsule (100 mg total) by mouth 2 (two) times daily. 02/19/17   Tatyana Kirichenko, PA-C  Doxylamine Succinate, Sleep, (SLEEP AID  PO) Take 2 tablets by mouth daily as needed (sleep). Reported on 06/05/2016    Historical Provider, MD  HYDROcodone-acetaminophen (NORCO) 5-325 MG tablet Take 1 tablet by mouth every 6 (six) hours as needed. 03/06/17   Zonie Crutcher Orlene OchM Tondra Reierson, NP  naproxen (NAPROSYN) 500 MG tablet Take 1 tablet (500 mg total) by mouth 2 (two) times daily. 02/19/17   Tatyana Kirichenko, PA-C  Sofosbuvir-Velpatasvir (EPCLUSA) 400-100 MG TABS Take 1 tablet by mouth daily. 12/10/16   Gardiner Barefootobert  W Comer, MD    Family History Family History  Problem Relation Age of Onset  . Anxiety disorder Mother   . Stroke Paternal Grandmother   . Cancer Paternal Grandmother   . Colon cancer Neg Hx     Social History Social History  Substance Use Topics  . Smoking status: Current Every Day Smoker    Packs/day: 0.50    Years: 16.00    Types: Cigars    Last attempt to quit: 12/13/2015  . Smokeless tobacco: Never Used  . Alcohol use 0.0 oz/week     Allergies   Bee venom   Review of Systems Review of Systems  Musculoskeletal: Positive for arthralgias (RLE), joint swelling (Right knee) and myalgias (RLE).  Skin: Positive for color change (Right knee). Negative for wound.     Physical Exam Updated Vital Signs BP (!) 144/91 (BP Location: Left Arm)   Pulse 98   Temp 98.9 F (37.2 C) (Oral)   Resp 18   Ht 5\' 10"  (1.778 m)   Wt 200 lb (90.7 kg)   SpO2 99%   BMI 28.70 kg/m   Physical Exam  Constitutional: He is oriented to person, place, and time. He appears well-developed and well-nourished. No distress.  HENT:  Head: Normocephalic and atraumatic.  Eyes: Conjunctivae are normal.  Neck: Neck supple.  Cardiovascular: Normal rate.   Pulses:      Dorsalis pedis pulses are 2+ on the right side, and 2+ on the left side.  Pulmonary/Chest: Effort normal.  Abdominal: Soft.  Musculoskeletal:       Right knee: He exhibits decreased range of motion and swelling. He exhibits normal alignment. Erythema: mild. Tenderness found.  Distal pulses 2+, adequate circulation. There is mild erythema to the right knee and right lower leg. There is tenderness with palpation and rang of motion of the knee.   Neurological: He is alert and oriented to person, place, and time.     Skin: Skin is warm and dry.  Psychiatric: He has a normal mood and affect.  Nursing note and vitals reviewed.    ED Treatments / Results  DIAGNOSTIC STUDIES: Oxygen Saturation is 99% on RA, normal by my  interpretation.    COORDINATION OF CARE: 10:11 PM- Pt advised of plan for treatment and pt agrees.  Labs (all labs ordered are listed, but only abnormal results are displayed) Labs Reviewed  CBC WITH DIFFERENTIAL/PLATELET - Abnormal; Notable for the following:       Result Value   WBC 12.2 (*)    Monocytes Absolute 1.5 (*)    All other components within normal limits  BASIC METABOLIC PANEL - Abnormal; Notable for the following:    CO2 21 (*)    Glucose, Bld 100 (*)    Calcium 8.5 (*)    All other components within normal limits  CULTURE, BLOOD (ROUTINE X 2)  CULTURE, BLOOD (ROUTINE X 2)  LACTIC ACID, PLASMA  LACTIC ACID, PLASMA    Radiology Dg Knee Complete 4 Views Right  Result Date: 03/05/2017 CLINICAL DATA:  Chronic right knee pain and swelling. Initial encounter. EXAM: RIGHT KNEE - COMPLETE 4+ VIEW COMPARISON:  None. FINDINGS: There is no evidence of fracture or dislocation. The joint spaces are preserved. No significant degenerative change is seen; the patellofemoral joint is grossly unremarkable in appearance. Trace knee joint fluid remains within normal limits. The visualized soft tissues are normal in appearance. IMPRESSION: No evidence of fracture or dislocation. Electronically Signed   By: Roanna Raider M.D.   On: 03/05/2017 21:15    Procedures Procedures (including critical care time)  Medications Ordered in ED Medications  clindamycin (CLEOCIN) capsule 300 mg (not administered)  oxyCODONE-acetaminophen (PERCOCET/ROXICET) 5-325 MG per tablet 1 tablet (1 tablet Oral Given 03/05/17 2309)  ketorolac (TORADOL) 15 MG/ML injection 15 mg (15 mg Intravenous Given 03/06/17 0114)   Dr. Bebe Shaggy in to examine the patient prior to d/c and discuss plan of care. Patient agrees to return in 2 days or sooner if symptoms worsen.   Initial Impression / Assessment and Plan / ED Course  I have reviewed the triage vital signs and the nursing notes.  Pertinent labs & imaging results  that were available during my care of the patient were reviewed by me and considered in my medical decision making (see chart for details).  Patient X-Ray negative for obvious fracture or dislocation. Pain managed in ED. Pt advised to follow up  Here in 2 days for recheck. Patient given brace while in ED, conservative therapy recommended and discussed. Patient will be dc home & is agreeable with above plan.   Final Clinical Impressions(s) / ED Diagnoses   Final diagnoses:  Sprain of right knee/leg, initial encounter  Cellulitis of right lower leg    New Prescriptions New Prescriptions   CLINDAMYCIN (CLEOCIN) 300 MG CAPSULE    Take 1 capsule (300 mg total) by mouth 3 (three) times daily.   DICLOFENAC (VOLTAREN) 50 MG EC TABLET    Take 1 tablet (50 mg total) by mouth 2 (two) times daily.   HYDROCODONE-ACETAMINOPHEN (NORCO) 5-325 MG TABLET    Take 1 tablet by mouth every 6 (six) hours as needed.  I personally performed the services described in this documentation, which was scribed in my presence. The recorded information has been reviewed and is accurate.    [address] [place], [place] 03/06/17 1610    Zadie Rhine, MD 03/07/17 [phone]

## 2017-03-05 NOTE — ED Triage Notes (Signed)
Pt. reports persistent right knee pain with mild swelling onset last month , denies injury/ambulatory .

## 2017-03-05 NOTE — ED Notes (Signed)
ED Provider at bedside. 

## 2017-03-06 LAB — BASIC METABOLIC PANEL
Anion gap: 10 (ref 5–15)
BUN: 8 mg/dL (ref 6–20)
CHLORIDE: 105 mmol/L (ref 101–111)
CO2: 21 mmol/L — ABNORMAL LOW (ref 22–32)
CREATININE: 0.82 mg/dL (ref 0.61–1.24)
Calcium: 8.5 mg/dL — ABNORMAL LOW (ref 8.9–10.3)
GFR calc Af Amer: >60 mL/min (ref >60)
GFR calc non Af Amer: >60 mL/min (ref >60)
Glucose, Bld: 100 mg/dL — ABNORMAL HIGH (ref 65–99)
Potassium: 3.7 mmol/L (ref 3.5–5.1)
SODIUM: 136 mmol/L (ref 135–145)

## 2017-03-06 LAB — LACTIC ACID, PLASMA: Lactic Acid, Venous: 1.3 mmol/L (ref 0.5–1.9)

## 2017-03-06 LAB — CBC WITH DIFFERENTIAL/PLATELET
Basophils Absolute: 0 10*3/uL (ref 0.0–0.1)
Basophils Relative: 0 %
Eosinophils Absolute: 0.1 10*3/uL (ref 0.0–0.7)
Eosinophils Relative: 1 %
HCT: 42.7 % (ref 39.0–52.0)
Hemoglobin: 14.1 g/dL (ref 13.0–17.0)
Lymphocytes Relative: 25 %
Lymphs Abs: 3.1 10*3/uL (ref 0.7–4.0)
MCH: 30.9 pg (ref 26.0–34.0)
MCHC: 33 g/dL (ref 30.0–36.0)
MCV: 93.4 fL (ref 78.0–100.0)
Monocytes Absolute: 1.5 10*3/uL — ABNORMAL HIGH (ref 0.1–1.0)
Monocytes Relative: 12 %
Neutro Abs: 7.5 10*3/uL (ref 1.7–7.7)
Neutrophils Relative %: 62 %
Platelets: 224 10*3/uL (ref 150–400)
RBC: 4.57 MIL/uL (ref 4.22–5.81)
RDW: 12.8 % (ref 11.5–15.5)
WBC: 12.2 10*3/uL — ABNORMAL HIGH (ref 4.0–10.5)

## 2017-03-06 MED ORDER — KETOROLAC TROMETHAMINE 15 MG/ML IJ SOLN
15.0000 mg | Freq: Once | INTRAMUSCULAR | Status: AC
  Administered 2017-03-06: 15 mg via INTRAVENOUS
  Filled 2017-03-06: qty 1

## 2017-03-06 MED ORDER — CLINDAMYCIN HCL 150 MG PO CAPS
300.0000 mg | ORAL_CAPSULE | Freq: Once | ORAL | Status: AC
  Administered 2017-03-06: 300 mg via ORAL
  Filled 2017-03-06: qty 2

## 2017-03-06 MED ORDER — DICLOFENAC SODIUM 50 MG PO TBEC: 50.0000 mg | DELAYED_RELEASE_TABLET | Freq: Two times a day (BID) | ORAL | 0 refills | Status: DC

## 2017-03-06 MED ORDER — CLINDAMYCIN HCL 300 MG PO CAPS: 300.0000 mg | ORAL_CAPSULE | Freq: Three times a day (TID) | ORAL | 0 refills | Status: DC

## 2017-03-06 MED ORDER — HYDROCODONE-ACETAMINOPHEN 5-325 MG PO TABS: 1.0000 | ORAL_TABLET | Freq: Four times a day (QID) | ORAL | 0 refills | Status: DC | PRN

## 2017-03-06 NOTE — Discharge Instructions (Signed)
Return in 2 days for recheck of your knee injury and for recheck of the cellulitis of the lower leg. Do not drive while taking the narcotic as it will make you sleepy.

## 2017-03-06 NOTE — Progress Notes (Signed)
Orthopedic Tech Progress Note Patient Details:  Frederick NeighboursChristopher J Young [birth year] [id number]  Ortho Devices Type of Ortho Device: Knee Immobilizer Ortho Device/Splint Location: rle Ortho Device/Splint Interventions: Ordered, Application   Trinna PostMartinez, [name] 03/06/2017, 3:28 AM

## 2017-03-06 NOTE — ED Notes (Signed)
No addl lab draw,  Pt being discharged

## 2017-03-11 LAB — CULTURE, BLOOD (ROUTINE X 2)
CULTURE: NO GROWTH
Culture: NO GROWTH

## 2017-06-02 ENCOUNTER — Ambulatory Visit: Payer: Self-pay

## 2017-06-09 ENCOUNTER — Ambulatory Visit: Payer: Self-pay | Admitting: Pharmacist

## 2017-06-09 DIAGNOSIS — B182 Chronic viral hepatitis C: Secondary | ICD-10-CM

## 2017-06-09 NOTE — Progress Notes (Signed)
Frederick Young came in to see Frederick Young to sign paperwork for [employer] and [name] approval. He has all of his labs needed.  Will reach out to him once he is approved.

## 2017-06-17 ENCOUNTER — Encounter: Payer: Self-pay | Admitting: Pharmacy Technician

## 2017-07-23 ENCOUNTER — Ambulatory Visit: Payer: Self-pay

## 2017-07-28 ENCOUNTER — Ambulatory Visit: Payer: Self-pay

## 2017-07-30 ENCOUNTER — Ambulatory Visit: Payer: Self-pay

## 2017-08-06 ENCOUNTER — Ambulatory Visit (INDEPENDENT_AMBULATORY_CARE_PROVIDER_SITE_OTHER): Payer: Self-pay | Admitting: Pharmacist

## 2017-08-06 DIAGNOSIS — B182 Chronic viral hepatitis C: Secondary | ICD-10-CM

## 2017-08-06 NOTE — Progress Notes (Signed)
HPI: Frederick Young is a 36 y.o. male who presents to the [facility] pharmacy clinic for Hep C follow-up. 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   Lab Results  Component Value Date   HCVGENOTYPE 3a 12/10/2016   HEPCGENOTYPE 3 04/11/2016    Allergies: Allergies  Allergen Reactions  . Bee Venom Anaphylaxis    Past Medical History: Past Medical History:  Diagnosis Date  . Anxiety   . Heart palpitations   . Hypercholesteremia   . Obesity   . Positive hepatitis C antibody test   . Substance abuse    S/P detox 03/18/16    Social History: Social History   Social History  . Marital status: Single    Spouse name: N/A  . Number of children: 2  . Years of education: N/A   Occupational History  . unemployeed [employer]   Social History Main Topics  . Smoking status: Current Every Day Smoker    Packs/day: 0.50    Years: 16.00    Types: Cigars    Last attempt to quit: 12/13/2015  . Smokeless tobacco: Never Used  . Alcohol use 0.0 oz/week  . Drug use: Yes    Types: Amphetamines, Marijuana, Cocaine, Heroin     Comment: none since 11/08/16 per patient  . Sexual activity: Yes   Other Topics Concern  . Not on file   Social History Narrative  . No narrative on file    Labs: Hep B S Ab (no units)  Date Value  05/31/2016 NEG   Hepatitis B Surface Ag (no units)  Date Value  05/31/2016 NEGATIVE   HCV Ab (no units)  Date Value  03/13/2016 REACTIVE (A)    Lab Results  Component Value Date   HCVGENOTYPE 3a 12/10/2016   HEPCGENOTYPE 3 04/11/2016    Hepatitis C RNA quantitative Latest Ref Rng & Units 03/13/2016  HCV Quantitative <15 IU/mL CANCELED  HCV Quantitative Log <1.18 log 10 CANCELED    AST (U/L)  Date Value  12/10/2016 159 (H)  05/13/2016 87 (H)  04/11/2016 97 (H)   ALT (U/L)  Date Value  12/10/2016 253 (H)  05/13/2016 95 (H)  04/11/2016 99 (H)   INR (no units)  Date Value  05/13/2016 1.21   04/11/2016 1.25    CrCl: CrCl cannot be calculated (Patient's most recent lab result is older than the maximum 21 days allowed.).  Fibrosis Score: F3/F4 as assessed by ARFI   Child-Pugh Score: A  Previous Treatment Regimen: None  Assessment: Frederick Young is here today to follow-up for his Hep C infection. He started 12 weeks of Epclusa on 7/14.  He has had to reschedule multiple times due to car trouble, so he was not able to come in at the 3-4 week mark on treatment.  He is having no issues with the medication, just occasional headaches.  He has not missed any doses. He usually takes it at 5pm every day and was maybe 30 minutes late taking it one day. He is very motivated to become cured.  He states he submitted paperwork to get labs covered as he is uninsured.  His baseline viral load was pretty high at 26.6 million.  I will get a HCV viral load today.  I spent some time going over his labs and fibrosis score.  He will come back at the end of therapy.      Plans: - Continue Epclusa x 12 weeks - Hep  C viral load today - F/u with me again 10/17 at 330pm  Frederick Young, PharmD, CPP Infectious Diseases Clinical Pharmacist [facility] 08/06/2017, 3:42 PM

## 2017-08-12 LAB — HEPATITIS C RNA QUANTITATIVE
HCV QUANT: NOT DETECTED [IU]/mL
HCV Quantitative Log: <1.18 Log IU/mL

## 2017-09-24 ENCOUNTER — Ambulatory Visit (INDEPENDENT_AMBULATORY_CARE_PROVIDER_SITE_OTHER): Payer: Self-pay | Admitting: Pharmacist Clinician (PhC)/ Clinical Pharmacy Specialist

## 2017-09-24 DIAGNOSIS — B182 Chronic viral hepatitis C: Secondary | ICD-10-CM

## 2017-09-24 NOTE — Patient Instructions (Signed)
Come back in 3 months for the cure lab then an appt with Dr. Luciana Axeomer in Feb

## 2017-09-24 NOTE — Progress Notes (Signed)
HPI: Frederick NeighboursChristopher J Young is a 36 y.o. male who is here for her hep C visit with pharmacy.   Lab Results  Component Value Date   HCVGENOTYPE 3a 12/10/2016   HEPCGENOTYPE 3 04/11/2016    Allergies: Allergies  Allergen Reactions  . Bee Venom Anaphylaxis    Vitals:    Past Medical History: Past Medical History:  Diagnosis Date  . Anxiety   . Heart palpitations   . Hypercholesteremia   . Obesity   . Positive hepatitis C antibody test   . Substance abuse    S/P detox 03/18/16    Social History: Social History   Social History  . Marital status: Single    Spouse name: N/A  . Number of children: 2  . Years of education: N/A   Occupational History  . unemployeed [employer]   Social History Main Topics  . Smoking status: Current Every Day Smoker    Packs/day: 0.50    Years: 16.00    Types: Cigars    Last attempt to quit: 12/13/2015  . Smokeless tobacco: Never Used  . Alcohol use 0.0 oz/week  . Drug use: Yes    Types: Amphetamines, Marijuana, Cocaine, Heroin     Comment: none since 11/08/16 per patient  . Sexual activity: Yes   Other Topics Concern  . Not on file   Social History Narrative  . No narrative on file    Labs: Hep B S Ab (no units)  Date Value  05/31/2016 NEG   Hepatitis B Surface Ag (no units)  Date Value  05/31/2016 NEGATIVE   HCV Ab (no units)  Date Value  03/13/2016 REACTIVE (A)    Lab Results  Component Value Date   HCVGENOTYPE 3a 12/10/2016   HEPCGENOTYPE 3 04/11/2016    Hepatitis C RNA quantitative Latest Ref Rng & Units 08/06/2017 03/13/2016  HCV Quantitative NOT DETECTED IU/mL <15 NOT DETECTED CANCELED  HCV Quantitative Log NOT DETECTED Log IU/mL <1.18 NOT DETECTED CANCELED    AST (U/L)  Date Value  12/10/2016 159 (H)  05/13/2016 87 (H)  04/11/2016 97 (H)   ALT (U/L)  Date Value  12/10/2016 253 (H)  05/13/2016 95 (H)  04/11/2016 99 (H)   INR (no units)  Date Value  05/13/2016 1.21  04/11/2016 1.25     CrCl: CrCl cannot be calculated (Patient's most recent lab result is older than the maximum 21 days allowed.).  Fibrosis Score: F3/4 as assessed by ARFI  Child-Pugh Score: Class A  Previous Treatment Regimen: None  Assessment: Frederick Young has finished his therapy with Epclusa for his genotype 3 about 2 wks ago and he is here for his EOT visit. His previous VL was undetectable. He did not missed a dose. I went over his fibrosis score again with him today and explain to him that if he is cured of hep C, he can be re-infected again is he is back to using IV drugs. He has significant fibrosis already. Hopefully, he got the message. We will get his EOT VL today. He'll get a SVR12 before he follows up with Dr. Luciana Axeomer for the cure visit.   Recommendations:  EOT VL and CMP SVR12 the cure visit with Dr. Drucilla Chaletomer  Frederick Young, VermontPharm.D., BCPS, AAHIVP Clinical Infectious Disease Pharmacist [facility] 09/24/2017, 3:55 PM

## 2017-09-26 LAB — COMPLETE METABOLIC PANEL WITH GFR
AG Ratio: 1.2 (calc) (ref 1.0–2.5)
ALKALINE PHOSPHATASE (APISO): 82 U/L (ref 40–115)
ALT: 44 U/L (ref 9–46)
AST: 24 U/L (ref 10–40)
Albumin: 4.6 g/dL (ref 3.6–5.1)
BILIRUBIN TOTAL: 0.3 mg/dL (ref 0.2–1.2)
BUN: 12 mg/dL (ref 7–25)
CHLORIDE: 98 mmol/L (ref 98–110)
CO2: 24 mmol/L (ref 20–32)
Calcium: 9.7 mg/dL (ref 8.6–10.3)
Creat: 0.96 mg/dL (ref 0.60–1.35)
GFR, EST AFRICAN AMERICAN: 118 mL/min/{1.73_m2} (ref >=60)
GFR, Est Non African American: 102 mL/min/{1.73_m2} (ref >=60)
GLUCOSE: 138 mg/dL — AB (ref 65–99)
Globulin: 3.7 g/dL (calc) (ref 1.9–3.7)
Potassium: 4.4 mmol/L (ref 3.5–5.3)
Sodium: 136 mmol/L (ref 135–146)
TOTAL PROTEIN: 8.3 g/dL — AB (ref 6.1–8.1)

## 2017-09-26 LAB — HEPATITIS C RNA QUANTITATIVE
HCV QUANT LOG: NOT DETECTED {Log_IU}/mL
HCV RNA, PCR, QN: <15 IU/mL

## 2017-12-13 IMAGING — DX DG CHEST 2V
2 series · 2 of 2 positions shown · non-contrast
Comparison: 01/03/2016

CLINICAL DATA: Short of breath. Headache. Left chest pain. Left
flank pain. Symptoms for 1 month. Prior smoker.

EXAM:
CHEST  2 VIEW

[w chest pa]
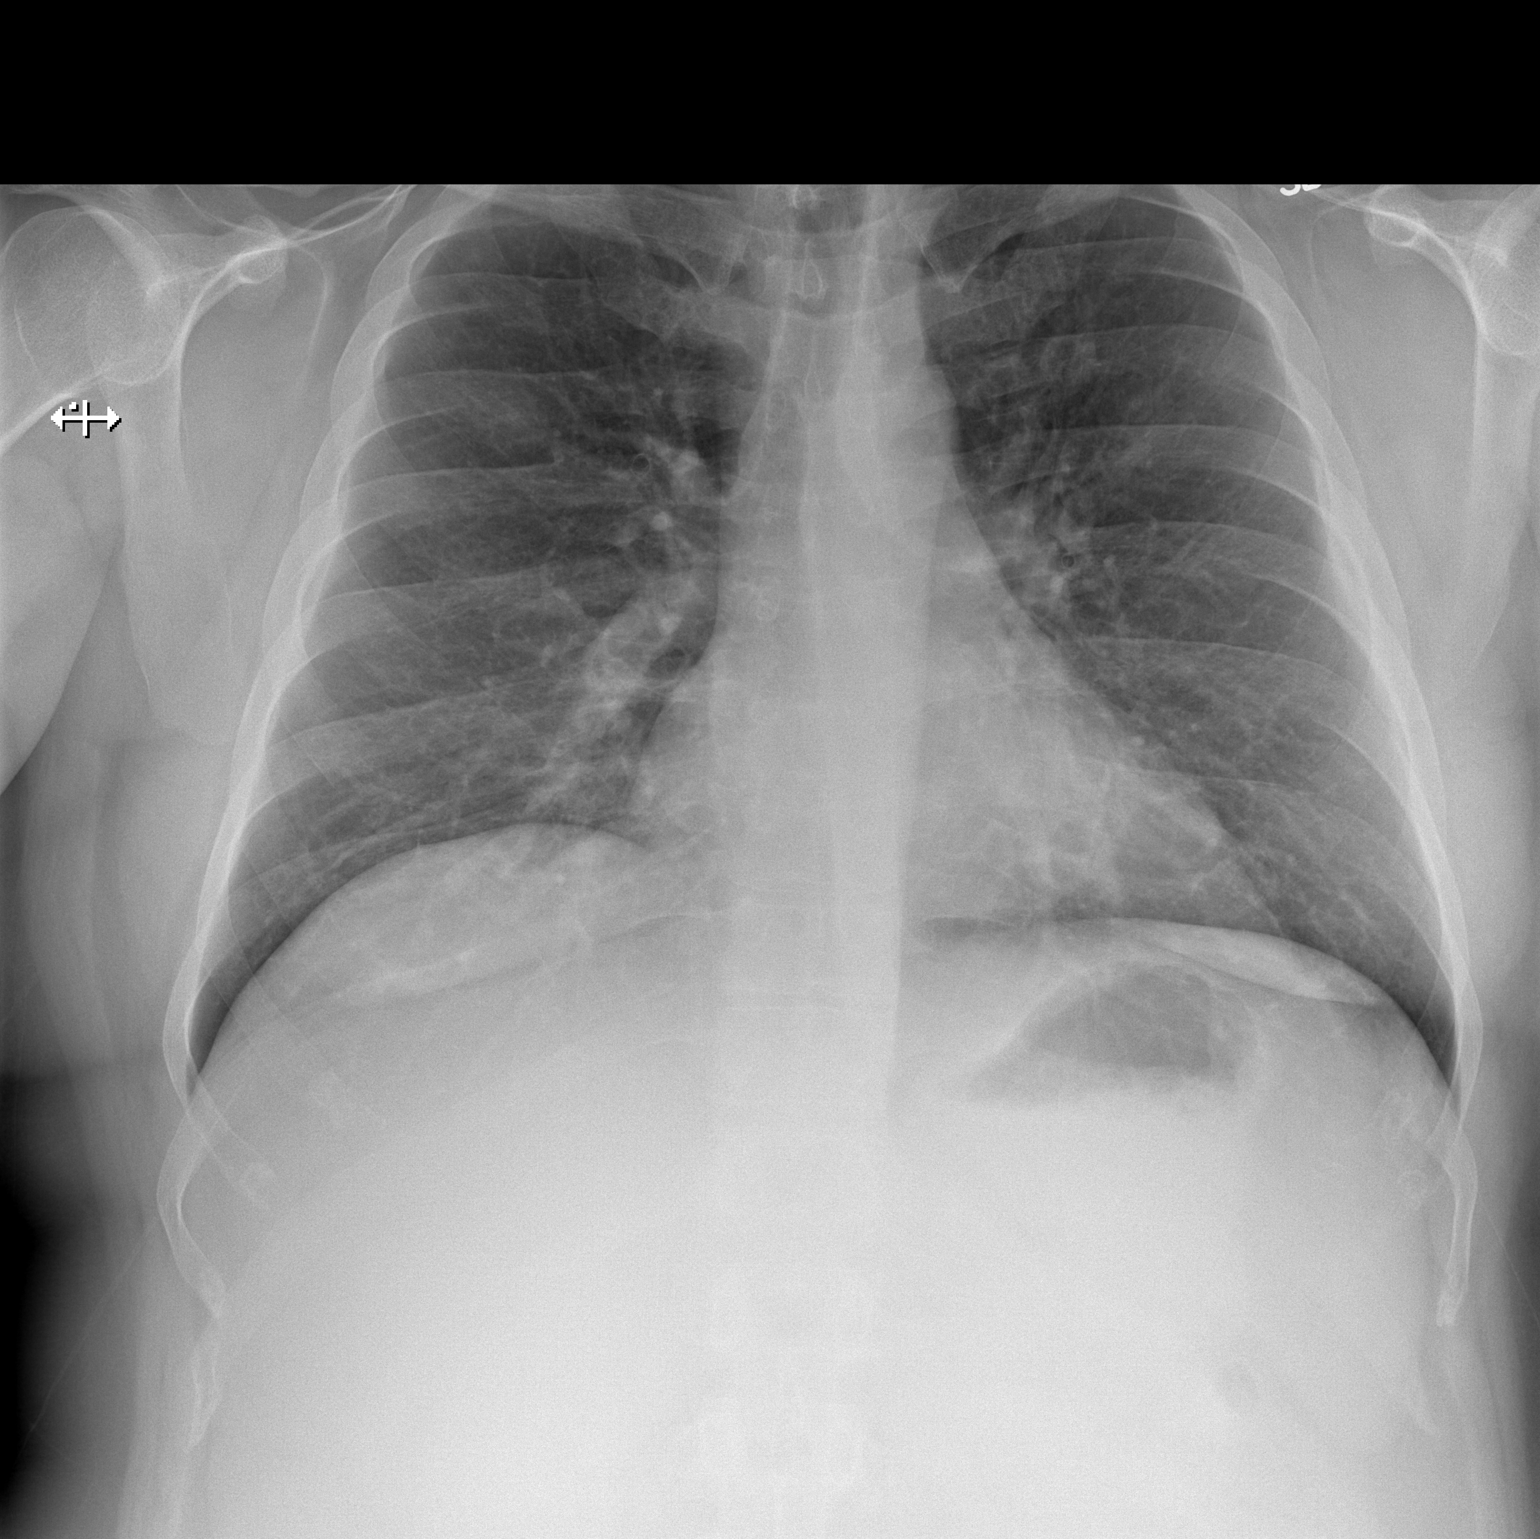

[w chest lat]
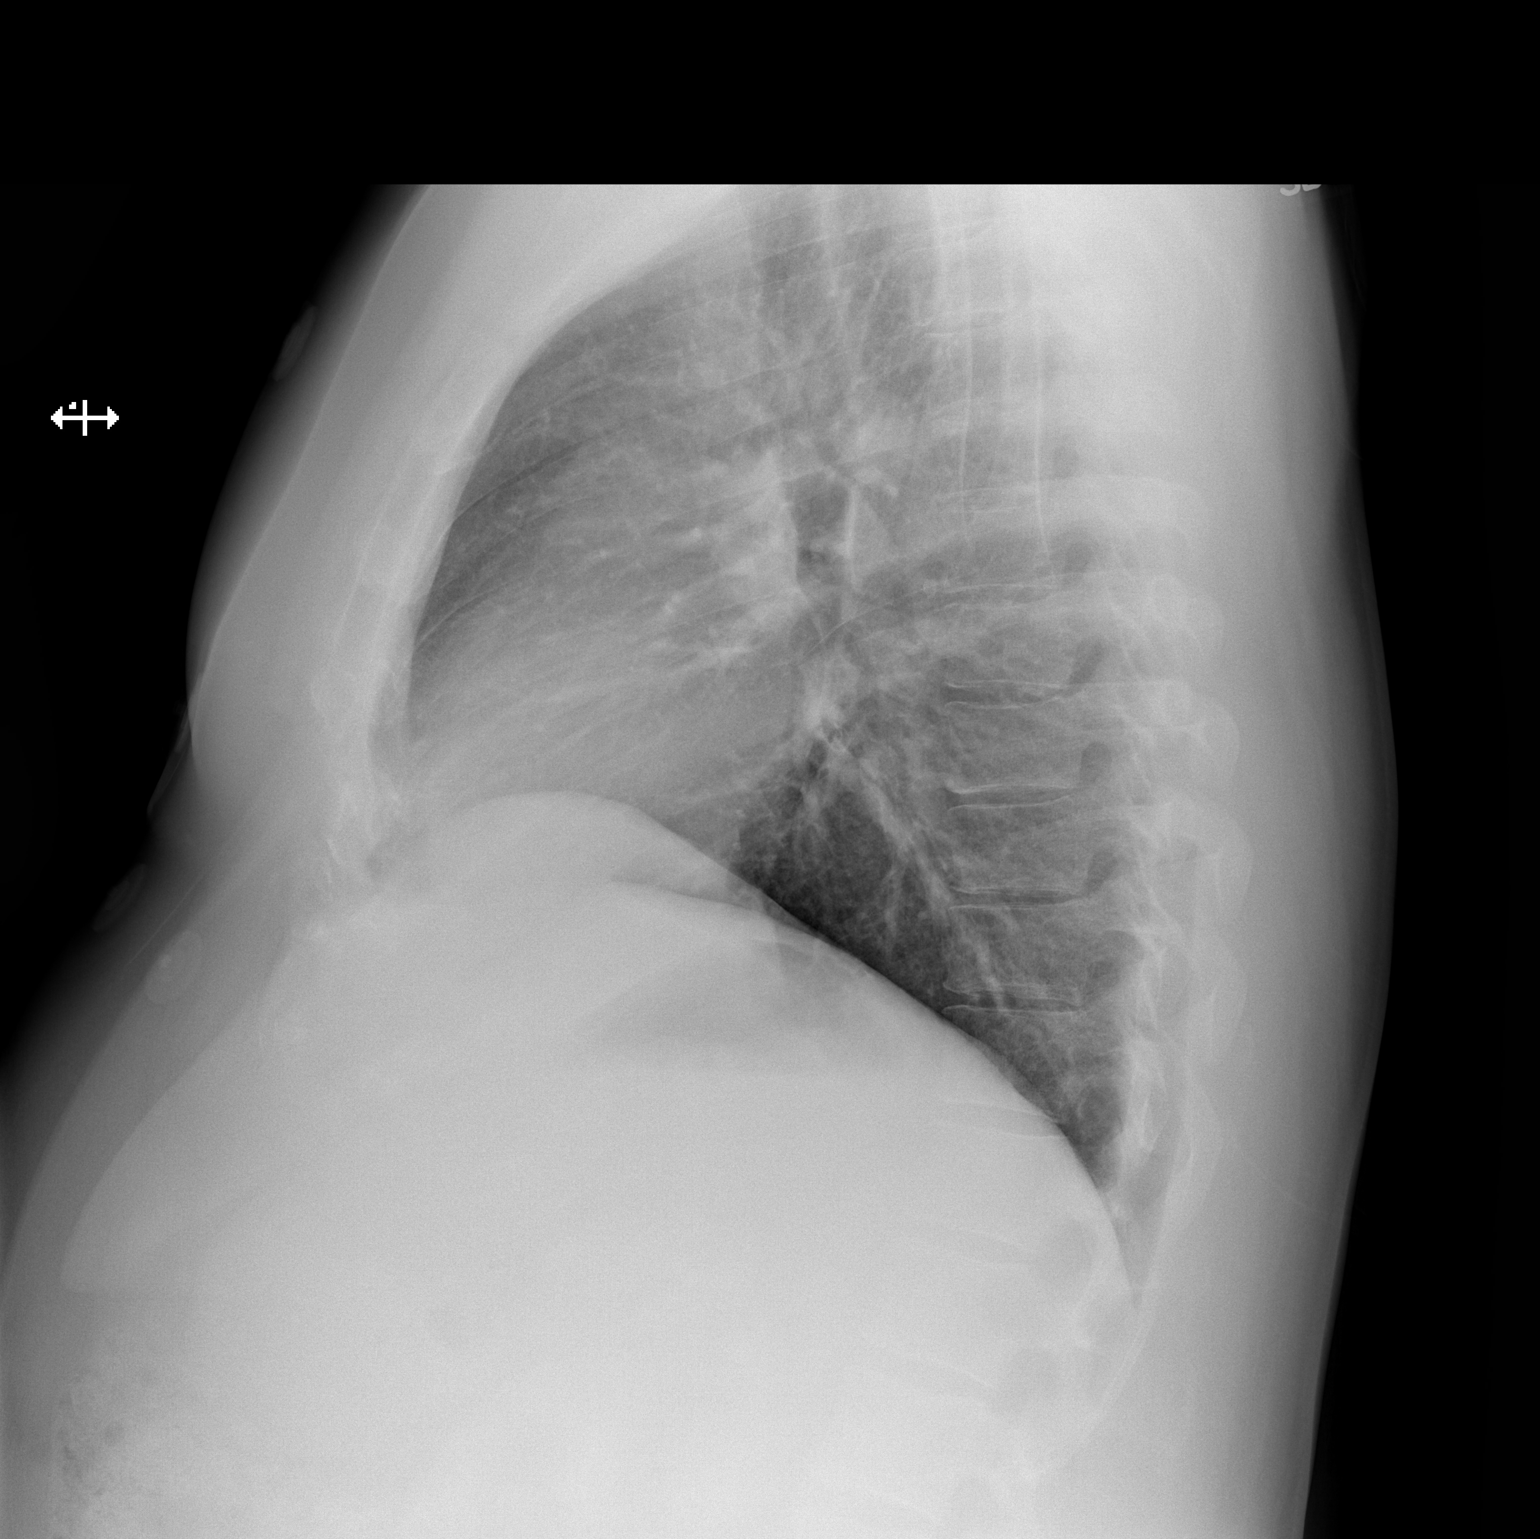

[2 of 2 positions shown; findings below may reference images not displayed]

FINDINGS: Normal heart size. Clear lungs. No pneumothorax. No pleural
effusion.
IMPRESSION: No active cardiopulmonary disease.

## 2017-12-24 ENCOUNTER — Other Ambulatory Visit: Payer: Self-pay

## 2017-12-31 ENCOUNTER — Other Ambulatory Visit: Payer: Self-pay

## 2017-12-31 DIAGNOSIS — B182 Chronic viral hepatitis C: Secondary | ICD-10-CM

## 2018-01-01 LAB — COMPLETE METABOLIC PANEL WITH GFR
AG RATIO: 1.4 (calc) (ref 1.0–2.5)
ALT: 24 U/L (ref 9–46)
AST: 20 U/L (ref 10–40)
Albumin: 4.5 g/dL (ref 3.6–5.1)
Alkaline phosphatase (APISO): 69 U/L (ref 40–115)
BILIRUBIN TOTAL: 0.2 mg/dL (ref 0.2–1.2)
BUN: 11 mg/dL (ref 7–25)
CHLORIDE: 104 mmol/L (ref 98–110)
CO2: 27 mmol/L (ref 20–32)
Calcium: 9.7 mg/dL (ref 8.6–10.3)
Creat: 0.88 mg/dL (ref 0.60–1.35)
GFR, EST AFRICAN AMERICAN: 129 mL/min/{1.73_m2} (ref >=60)
GFR, Est Non African American: 111 mL/min/{1.73_m2} (ref >=60)
GLUCOSE: 129 mg/dL — AB (ref 65–99)
Globulin: 3.3 g/dL (calc) (ref 1.9–3.7)
POTASSIUM: 4.3 mmol/L (ref 3.5–5.3)
Sodium: 140 mmol/L (ref 135–146)
TOTAL PROTEIN: 7.8 g/dL (ref 6.1–8.1)

## 2018-01-02 LAB — HEPATITIS C RNA QUANTITATIVE
HCV Quantitative Log: <1.18 Log IU/mL
HCV RNA, PCR, QN: <15 IU/mL

## 2018-01-21 ENCOUNTER — Ambulatory Visit: Payer: Self-pay | Admitting: Internal Medicine

## 2018-02-04 ENCOUNTER — Emergency Department (HOSPITAL_COMMUNITY)
Admission: EM | Admit: 2018-02-04 | Discharge: 2018-02-04 | Disposition: A | Discharge status: Home / Self Care | Payer: Self-pay | Attending: Emergency Medicine | Admitting: Emergency Medicine

## 2018-02-04 ENCOUNTER — Emergency Department (HOSPITAL_COMMUNITY): Payer: Self-pay

## 2018-02-04 ENCOUNTER — Encounter (HOSPITAL_COMMUNITY): Payer: Self-pay | Admitting: Family Medicine

## 2018-02-04 DIAGNOSIS — F1410 Cocaine abuse, uncomplicated: Secondary | ICD-10-CM | POA: Insufficient documentation

## 2018-02-04 DIAGNOSIS — F17210 Nicotine dependence, cigarettes, uncomplicated: Secondary | ICD-10-CM | POA: Insufficient documentation

## 2018-02-04 DIAGNOSIS — T6591XA Toxic effect of unspecified substance, accidental (unintentional), initial encounter: Secondary | ICD-10-CM | POA: Insufficient documentation

## 2018-02-04 DIAGNOSIS — R0681 Apnea, not elsewhere classified: Secondary | ICD-10-CM

## 2018-02-04 DIAGNOSIS — F1110 Opioid abuse, uncomplicated: Secondary | ICD-10-CM | POA: Insufficient documentation

## 2018-02-04 LAB — CBC WITH DIFFERENTIAL/PLATELET
Basophils Absolute: 0 10*3/uL (ref 0.0–0.1)
Basophils Relative: 0 %
Eosinophils Absolute: 0.1 10*3/uL (ref 0.0–0.7)
Eosinophils Relative: 1 %
HCT: 44.8 % (ref 39.0–52.0)
HEMOGLOBIN: 14.8 g/dL (ref 13.0–17.0)
LYMPHS ABS: 1.5 10*3/uL (ref 0.7–4.0)
LYMPHS PCT: 14 %
MCH: 30.6 pg (ref 26.0–34.0)
MCHC: 33 g/dL (ref 30.0–36.0)
MCV: 92.6 fL (ref 78.0–100.0)
Monocytes Absolute: 1 10*3/uL (ref 0.1–1.0)
Monocytes Relative: 10 %
NEUTROS PCT: 75 %
Neutro Abs: 8 10*3/uL — ABNORMAL HIGH (ref 1.7–7.7)
Platelets: 207 10*3/uL (ref 150–400)
RBC: 4.84 MIL/uL (ref 4.22–5.81)
RDW: 13.5 % (ref 11.5–15.5)
WBC: 10.6 10*3/uL — AB (ref 4.0–10.5)

## 2018-02-04 LAB — COMPREHENSIVE METABOLIC PANEL
ALT: 25 U/L (ref 17–63)
AST: 24 U/L (ref 15–41)
Albumin: 4.1 g/dL (ref 3.5–5.0)
Alkaline Phosphatase: 71 U/L (ref 38–126)
Anion gap: 9 (ref 5–15)
BUN: 15 mg/dL (ref 6–20)
CO2: 26 mmol/L (ref 22–32)
CREATININE: 1.12 mg/dL (ref 0.61–1.24)
Calcium: 9 mg/dL (ref 8.9–10.3)
Chloride: 104 mmol/L (ref 101–111)
GFR calc Af Amer: >60 mL/min (ref >60)
Glucose, Bld: 119 mg/dL — ABNORMAL HIGH (ref 65–99)
POTASSIUM: 4.3 mmol/L (ref 3.5–5.1)
Sodium: 139 mmol/L (ref 135–145)
Total Bilirubin: 0.4 mg/dL (ref 0.3–1.2)
Total Protein: 7.6 g/dL (ref 6.5–8.1)

## 2018-02-04 LAB — RAPID URINE DRUG SCREEN, HOSP PERFORMED
Amphetamines: NOT DETECTED
Barbiturates: NOT DETECTED
Benzodiazepines: POSITIVE — AB
Cocaine: POSITIVE — AB
Opiates: POSITIVE — AB
Tetrahydrocannabinol: NOT DETECTED

## 2018-02-04 LAB — ETHANOL

## 2018-02-04 LAB — ACETAMINOPHEN LEVEL: Acetaminophen (Tylenol), Serum: <10 ug/mL — ABNORMAL LOW (ref 10–30)

## 2018-02-04 LAB — SALICYLATE LEVEL: Salicylate Lvl: <7 mg/dL (ref 2.8–30.0)

## 2018-02-04 MED ORDER — SODIUM CHLORIDE 0.9 % IV BOLUS (SEPSIS)
1000.0000 mL | Freq: Once | INTRAVENOUS | Status: AC
  Administered 2018-02-04: 1000 mL via INTRAVENOUS

## 2018-02-04 NOTE — ED Notes (Signed)
Pt provided urinal, labeled specimen cup/urine cx at bedside. Pt advised to call for assistance as needed. [employer]

## 2018-02-04 NOTE — ED Notes (Signed)
Patient was wanting to leave and reported he had a ride coming. When the ride arrived, which is his father, Dr. Casimer LaniusE. Schlossman came to bedside. She had a conversation with the patient to stay a little longer for observation. Patient has agreed upon treatment.

## 2018-02-04 NOTE — ED Triage Notes (Signed)
Patient is from home and [facility] 4-5 Vicodin 10mg  and 3 50mg  Sleep Aid medication to help with his chronic knee pain. Patient was apnea on arrival of fire. When EMS arrived, 0.5mg  of NARCAN was administered. On the way to facility, patient started complaining of neck stiffness. EMS placed a collar. He was found on the floor

## 2018-02-04 NOTE — ED Notes (Signed)
Patient in radiology

## 2018-02-04 NOTE — ED Provider Notes (Signed)
[facility]-EMERGENCY DEPT Provider Note   CSN: [id number] Arrival date & time: 02/04/18  1857     History   Chief Complaint Chief Complaint  Patient presents with  . Ingestion    HPI Frederick Young is a 37 y.o. male.  HPI   Took percocet, not sure how much, does not remember. Was rx for 30 he thinks Early today took xanax and clonazepam which he had left over for panic disorder Took klonopin this AM prior to court Sleep aid and vicodin around 430 to 530PM Took 50mg  sleep aid, thinks it was a diphenhydramine sleep aid, took approximately 5 tablets Has insomnia, was just trying to sleep Denies suicidal ideation Remembers feeling lightheaded, sat down on the bed Was on the floor Reports no heroin or fentanyl use today, used in past, was snorting it mainly, occasional injection Now feel tired, otherwise not feeling badly.Feels dehydrated.    Girlfriend arrived, reports he had been out for approximately 12 minutes.  Reports initially he appeared to have slowed breathing and was unresponsive, however then he seemed to stop breathing and had turned blue.  She started doing CPR.  Past Medical History:  Diagnosis Date  . Anxiety   . Heart palpitations   . Hypercholesteremia   . Obesity   . Positive hepatitis C antibody test   . Substance abuse (HCC)    S/P detox 03/18/16    Patient Active Problem List   Diagnosis Date Noted  . Chronic hepatitis C (HCC) 05/13/2016  . Hepatic cirrhosis (HCC) 05/13/2016  . Reflux esophagitis   . Hepatitis C antibody test positive 04/11/2016  . Abnormal CT of the abdomen 04/11/2016  . Polysubstance abuse (HCC) 04/11/2016  . Hematemesis 04/11/2016  . Atypical chest pain 03/14/2016  . Hypokalemia 03/14/2016  . Weight gain 03/14/2016  . Essential tremor 03/14/2016  . Substance induced mood disorder (HCC) 06/01/2014  . Opioid dependence (HCC) 06/01/2014  . Alcohol dependence (HCC) 06/01/2014  . Benzodiazepine  dependence (HCC) 06/01/2014  . 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 NICOTINE ADDICTION 11/10/2007  . PANIC DISORDER 11/06/2007  . ANXIETY STATE, UNSPECIFIED 02/12/2007    Past Surgical History:  Procedure Laterality Date  . ESOPHAGOGASTRODUODENOSCOPY (EGD) WITH PROPOFOL N/A 04/22/2016   Procedure: ESOPHAGOGASTRODUODENOSCOPY (EGD) WITH PROPOFOL;  Surgeon: Corbin Adeobert M Rourk, MD;  Location: [facility];  Service: Endoscopy;  Laterality: N/A;  0830  . None to date         Home Medications    Prior to Admission medications   Medication Sig Start Date End Date Taking? Authorizing Provider  clonazePAM (KLONOPIN) 0.5 MG tablet Take 0.5 mg by mouth 2 (two) times daily as needed for anxiety.   Yes [provider]  Doxylamine Succinate, Sleep, (SLEEP AID PO) Take 2 tablets by mouth daily as needed (sleep). Reported on 06/05/2016   Yes [provider]  HYDROcodone-acetaminophen (NORCO) 5-325 MG tablet Take 1 tablet by mouth every 6 (six) hours as needed. Patient taking differently: Take 1 tablet by mouth every 6 (six) hours as needed for moderate pain or severe pain.  03/06/17  Yes Neese, Hope M, NP  clindamycin (CLEOCIN) 300 MG capsule Take 1 capsule (300 mg total) by mouth 3 (three) times daily. Patient not taking: Reported on 02/04/2018 03/06/17   Janne NapoleonNeese, Hope M, NP  diclofenac (VOLTAREN) 50 MG EC tablet Take 1 tablet (50 mg total) by mouth 2 (two) times daily. Patient not taking: Reported on 02/04/2018 03/06/17   Janne NapoleonNeese, Hope M,  NP  doxycycline (VIBRAMYCIN) 100 MG capsule Take 1 capsule (100 mg total) by mouth 2 (two) times daily. Patient not taking: Reported on 02/04/2018 02/19/17   Jaynie Crumble, PA-C  naproxen (NAPROSYN) 500 MG tablet Take 1 tablet (500 mg total) by mouth 2 (two) times daily. Patient not taking: Reported on 02/04/2018 02/19/17   Jaynie Crumble, PA-C    Family History Family History  Problem Relation Age of Onset  . Anxiety disorder Mother   . Stroke  Paternal Grandmother   . Cancer Paternal Grandmother   . Colon cancer Neg Hx     Social History Social History   Tobacco Use  . Smoking status: Current Every Day Smoker    Packs/day: 0.50    Years: 16.00    Pack years: 8.00    Types: Cigars    Last attempt to quit: 12/13/2015    Years since quitting: 2.1  . Smokeless tobacco: Never Used  Substance Use Topics  . Alcohol use: Yes    Alcohol/week: 0.0 oz  . Drug use: Yes    Types: Amphetamines, Marijuana, Cocaine, Heroin     Allergies   Bee venom   Review of Systems Review of Systems  Constitutional: Negative for fever.  HENT: Negative for sore throat.   Eyes: Negative for visual disturbance.  Respiratory: Negative for shortness of breath.   Cardiovascular: Negative for chest pain.  Gastrointestinal: Negative for abdominal pain, nausea and vomiting.  Genitourinary: Negative for difficulty urinating.  Musculoskeletal: Negative for back pain and neck stiffness.  Skin: Negative for rash.  Neurological: Negative for syncope and headaches.     Physical Exam Updated Vital Signs BP 104/70   Pulse 78   Temp 98.3 F (36.8 C) (Oral)   Resp 15   Ht 5\' 9"  (1.753 m)   Wt 86.2 kg (190 lb)   SpO2 93%   BMI 28.06 kg/m   Physical Exam  Constitutional: He is oriented to person, place, and time. He appears well-developed and well-nourished. No distress.  HENT:  Head: Normocephalic and atraumatic.  Eyes: Conjunctivae and EOM are normal.  Neck: Normal range of motion.  Cardiovascular: Normal rate, regular rhythm, normal heart sounds and intact distal pulses. Exam reveals no gallop and no friction rub.  No murmur heard. Pulmonary/Chest: Effort normal and breath sounds normal. No respiratory distress. He has no wheezes. He has no rales.  Abdominal: Soft. He exhibits no distension. There is no tenderness. There is no guarding.  Musculoskeletal: He exhibits no edema.  Neurological: He is alert and oriented to person, place, and  time.  Skin: Skin is warm and dry. He is not diaphoretic.  Nursing note and vitals reviewed.    ED Treatments / Results  Labs (all labs ordered are listed, but only abnormal results are displayed) Labs Reviewed  COMPREHENSIVE METABOLIC PANEL - Abnormal; Notable for the following components:      Result Value   Glucose, Bld 119 (*)    All other components within normal limits  RAPID URINE DRUG SCREEN, HOSP PERFORMED - Abnormal; Notable for the following components:   Opiates POSITIVE (*)    Cocaine POSITIVE (*)    Benzodiazepines POSITIVE (*)    All other components within normal limits  CBC WITH DIFFERENTIAL/PLATELET - Abnormal; Notable for the following components:   WBC 10.6 (*)    Neutro Abs 8.0 (*)    All other components within normal limits  ACETAMINOPHEN LEVEL - Abnormal; Notable for the following components:   Acetaminophen (Tylenol),  Serum <10 (*)    All other components within normal limits  ETHANOL  SALICYLATE LEVEL    EKG  EKG Interpretation None       Radiology Dg Chest 2 View  Result Date: 02/04/2018 CLINICAL DATA:  Drug overdose.  Shortness of Breath EXAM: CHEST  2 VIEW COMPARISON:  Chest CT March 19, 2016; chest radiograph February 28, 2016 FINDINGS: Lungs are clear. Heart size and pulmonary vascularity are normal. No adenopathy. No bone lesions. IMPRESSION: No edema or consolidation. Electronically Signed   By: Bretta Bang III M.D.   On: 02/04/2018 20:41    Procedures Procedures (including critical care time)  Medications Ordered in ED Medications  sodium chloride 0.9 % bolus 1,000 mL (0 mLs Intravenous Stopped 02/04/18 2316)     Initial Impression / Assessment and Plan / ED Course  I have reviewed the triage vital signs and the nursing notes.  Pertinent labs & imaging results that were available during my care of the patient were reviewed by me and considered in my medical decision making (see chart for details).    37 year old male with a  history of anxiety, hepatitis C, substance abuse, who presents with concern for overdose.  Patient reports that he took Percocet and sleeping pills because he wanted to sleep, and subsequently developed unresponsiveness and apnea, and girlfriend called EMS.  On EMS arrival, he was given 0.5 mg of Narcan, with improvement.    On my evaluation, patient is awake.  He denies suicidal ideation, reports he did this by accident to try to sleep.  Will check Tylenol levels, and continue to the observed in the emergency department for rebound of apnea.  Given duration of unresponsiveness, and question of mild hypoxia on arrival, will order chest x-ray to evaluate for signs of aspiration pneumonia.  Patient reports taking diphenhydramine, however this time does not show any signs of anticholinergic toxidrome.  Poison control recommends obs for 6 hours for diphenydramine, and 4-6 hours after narcan for opiate OD.  Labs show undetectable Tylenol level, which is drawn approximately 4 hours after patient reported taking the medications. Salicylate negative. No transaminitis. XR evaluated by me without signs of pneumonia. EKG evaluated by me without changes in QRS. Labs also significant for UDS positive for cocaine in addition to the reported benzo and opiate.  Given history, lab findings, I am not sure he has been completely honest regarding his ingestion or drug use today.    Patient observed for 4 hours after narcan without return of apnea and with normal oxygenation on room air,  Blood pressures borderline in 90s however improved with fluids.  He was observed 6 hours after ingestion of benadryl.   Discussed with him in detail concern regarding his apnea in setting of drug use.  Recommend outpatient psychiatric and substance abuse evaluation.  He states understanding. Discharged with father and girlfriend.   Final Clinical Impressions(s) / ED Diagnoses   Final diagnoses:  Accidental ingestion of substance, initial  encounter  Apnea    ED Discharge Orders    None       Alvira Monday, MD 02/05/18 [phone]

## 2018-02-04 NOTE — ED Notes (Signed)
Decreased oxygen to 1 L via CO2 monitor.

## 2018-02-04 NOTE — ED Notes (Signed)
Bed: WA20 Expected date:  Expected time:  Means of arrival:  Comments: EMS overdose

## 2018-03-03 ENCOUNTER — Ambulatory Visit (INDEPENDENT_AMBULATORY_CARE_PROVIDER_SITE_OTHER): Payer: No Typology Code available for payment source | Admitting: Internal Medicine

## 2018-03-03 ENCOUNTER — Encounter: Payer: Self-pay | Admitting: Internal Medicine

## 2018-03-03 VITALS — BP 117/73 | HR 88 | Temp 98.6°F | Resp 18 | Ht 69.0 in | Wt 210.0 lb

## 2018-03-03 DIAGNOSIS — B182 Chronic viral hepatitis C: Secondary | ICD-10-CM

## 2018-03-03 DIAGNOSIS — F1121 Opioid dependence, in remission: Secondary | ICD-10-CM

## 2018-03-03 DIAGNOSIS — F1029 Alcohol dependence with unspecified alcohol-induced disorder: Secondary | ICD-10-CM

## 2018-03-03 DIAGNOSIS — K7460 Unspecified cirrhosis of liver: Secondary | ICD-10-CM

## 2018-03-03 NOTE — Assessment & Plan Note (Signed)
I encouarged avoidance of alcohol with liver damage.

## 2018-03-03 NOTE — Assessment & Plan Note (Signed)
Now considered cured.  I reminded him that he can become reinfected.

## 2018-03-03 NOTE — Progress Notes (Signed)
   Subjective:    Patient ID: Darvin NeighboursChristopher J Frazer, male    DOB: 1981-05-08, [age] y.o.   MRN: [id number]  HPI Here for follow up of chronichepatitis C. His SVR 12 is negative confirming cure.  Unfortunately he does continue to have issues with drug use.  Relapsed with IVDU but continues to be motivated to quit.  Some weight gain.  Otherwise no new issues.    Review of Systems  Constitutional: Negative for fatigue.  Gastrointestinal: Negative for diarrhea.  Skin: Negative for rash.       Objective:   Physical Exam  Constitutional: He appears well-developed and well-nourished. No distress.  Eyes: No scleral icterus.  Cardiovascular: Normal rate, regular rhythm and normal heart sounds.  No murmur heard. Pulmonary/Chest: Effort normal and breath sounds normal. No respiratory distress.  Skin: No rash noted.   SH: some drug use, + tobacco       Assessment & Plan:

## 2018-03-03 NOTE — Assessment & Plan Note (Signed)
I encouraged complete restrain from IVDU.  He is continuing efforts to remain clean.

## 2018-03-03 NOTE — Assessment & Plan Note (Signed)
F3/4 on elastography.  Fortunately no cirrhosis on ultrasound.  No lab abnormalities.  Will do HCC screening every 6 months.  Will consider GI referral for any concerns but not indicated at this time.

## 2018-03-18 ENCOUNTER — Ambulatory Visit
Admission: RE | Admit: 2018-03-18 | Discharge: 2018-03-18 | Disposition: A | Discharge status: Home / Self Care | Payer: No Typology Code available for payment source | Source: Ambulatory Visit | Attending: Internal Medicine | Admitting: Internal Medicine

## 2018-03-18 DIAGNOSIS — K7460 Unspecified cirrhosis of liver: Secondary | ICD-10-CM

## 2018-09-02 ENCOUNTER — Ambulatory Visit: Payer: Self-pay | Admitting: Internal Medicine

## 2019-06-13 ENCOUNTER — Encounter (HOSPITAL_COMMUNITY): Payer: Self-pay | Admitting: Emergency Medicine

## 2019-06-13 ENCOUNTER — Other Ambulatory Visit: Payer: Self-pay

## 2019-06-13 ENCOUNTER — Emergency Department (HOSPITAL_COMMUNITY)
Admission: EM | Admit: 2019-06-13 | Discharge: 2019-06-13 | Disposition: A | Discharge status: Home / Self Care | Payer: No Typology Code available for payment source | Attending: Emergency Medicine | Admitting: Emergency Medicine

## 2019-06-13 DIAGNOSIS — F17290 Nicotine dependence, other tobacco product, uncomplicated: Secondary | ICD-10-CM | POA: Insufficient documentation

## 2019-06-13 DIAGNOSIS — Z79899 Other long term (current) drug therapy: Secondary | ICD-10-CM | POA: Insufficient documentation

## 2019-06-13 DIAGNOSIS — L02413 Cutaneous abscess of right upper limb: Secondary | ICD-10-CM | POA: Insufficient documentation

## 2019-06-13 MED ORDER — CLINDAMYCIN PHOSPHATE 600 MG/50ML IV SOLN
600.0000 mg | Freq: Once | INTRAVENOUS | Status: AC
  Administered 2019-06-13: 07:00:00 600 mg via INTRAVENOUS
  Filled 2019-06-13: qty 50

## 2019-06-13 MED ORDER — LIDOCAINE HCL (PF) 1 % IJ SOLN
5.0000 mL | Freq: Once | INTRAMUSCULAR | Status: AC
  Administered 2019-06-13: 06:00:00 5 mL via INTRADERMAL
  Filled 2019-06-13: qty 5

## 2019-06-13 MED ORDER — CLINDAMYCIN HCL 300 MG PO CAPS: 300.0000 mg | ORAL_CAPSULE | Freq: Four times a day (QID) | ORAL | 0 refills | Status: AC

## 2019-06-13 NOTE — ED Triage Notes (Signed)
Pt presents with an abscess to his right AC area. Pt denies any injections.

## 2019-06-13 NOTE — ED Provider Notes (Signed)
[facility]   CSN: [id number] Arrival date & time: 06/13/19  [id number]     History   Chief Complaint Chief Complaint  Patient presents with  . Abscess    HPI Frederick NeighboursChristopher J Young is a 38 y.o. male.     Patient is a 38 year old male with past medical history of hepatitis C, polysubstance abuse.  He presents today for evaluation of pain and swelling to his right arm.  He has a painful, swollen knot just below the elbow.  He denies to me that he has used any intravenous drugs.  The history is provided by the patient.  Abscess Location:  Shoulder/arm Abscess quality: fluctuance, induration, painful, redness and warmth   Duration:  3 days Progression:  Worsening Pain details:    Quality:  Aching   Severity:  Moderate   Timing:  Constant   Progression:  Worsening Chronicity:  New Relieved by:  Nothing Worsened by:  Nothing Ineffective treatments:  None tried   Past Medical History:  Diagnosis Date  . Anxiety   . Heart palpitations   . Hypercholesteremia   . Obesity   . Positive hepatitis C antibody test   . Substance abuse (HCC)    S/P detox 03/18/16    Patient Active Problem List   Diagnosis Date Noted  . Chronic hepatitis C (HCC) 05/13/2016  . Hepatic cirrhosis (HCC) 05/13/2016  . Reflux esophagitis   . Hepatitis C antibody test positive 04/11/2016  . Abnormal CT of the abdomen 04/11/2016  . Polysubstance abuse (HCC) 04/11/2016  . Hematemesis 04/11/2016  . Atypical chest pain 03/14/2016  . Hypokalemia 03/14/2016  . Weight gain 03/14/2016  . Essential tremor 03/14/2016  . Substance induced mood disorder (HCC) 06/01/2014  . Opioid dependence (HCC) 06/01/2014  . Alcohol dependence (HCC) 06/01/2014  . Benzodiazepine dependence (HCC) 06/01/2014  . 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 NICOTINE ADDICTION 11/10/2007  . PANIC DISORDER 11/06/2007  . ANXIETY STATE, UNSPECIFIED 02/12/2007    Past Surgical  History:  Procedure Laterality Date  . ESOPHAGOGASTRODUODENOSCOPY (EGD) WITH PROPOFOL N/A 04/22/2016   Procedure: ESOPHAGOGASTRODUODENOSCOPY (EGD) WITH PROPOFOL;  Surgeon: Corbin Adeobert M Rourk, MD;  Location: [facility];  Service: Endoscopy;  Laterality: N/A;  0830  . None to date          Home Medications    Prior to Admission medications   Medication Sig Start Date End Date Taking? Authorizing Provider  clonazePAM (KLONOPIN) 0.5 MG tablet Take 0.5 mg by mouth 2 (two) times daily as needed for anxiety.    [provider]  Doxylamine Succinate, Sleep, (SLEEP AID PO) Take 2 tablets by mouth daily as needed (sleep). Reported on 06/05/2016    [provider]    Family History Family History  Problem Relation Age of Onset  . Anxiety disorder Mother   . Stroke Paternal Grandmother   . Cancer Paternal Grandmother   . Colon cancer Neg Hx     Social History Social History   Tobacco Use  . Smoking status: Current Every Day Smoker    Packs/day: 0.50    Years: 16.00    Pack years: 8.00    Types: Cigars    Last attempt to quit: 12/13/2015    Years since quitting: 3.5  . Smokeless tobacco: Never Used  Substance Use Topics  . Alcohol use: Yes    Alcohol/week: 0.0 standard drinks  . Drug use: Yes    Types: Amphetamines, Marijuana, Cocaine, Heroin  Allergies   Bee venom   Review of Systems Review of Systems  All other systems reviewed and are negative.    Physical Exam Updated Vital Signs BP 109/84 (BP Location: Left Arm)   Pulse (!) 103   Temp 98.7 F (37.1 C) (Oral)   Resp 20   Ht 5\' 10"  (1.778 m)   Wt 88.5 kg   SpO2 100%   BMI 27.98 kg/m   Physical Exam Vitals signs and nursing note reviewed.  Constitutional:      General: He is not in acute distress.    Appearance: Normal appearance. He is not ill-appearing.  HENT:     Head: Normocephalic and atraumatic.  Cardiovascular:     Pulses: Normal pulses.  Musculoskeletal: Normal range of  motion.  Skin:    Comments: There is a swollen, firm, indurated, fluctuant area located just distal to the antecubital fossa of the right forearm.  Neurological:     Mental Status: He is alert and oriented to person, place, and time.      ED Treatments / Results  Labs (all labs ordered are listed, but only abnormal results are displayed) Labs Reviewed - No data to display  EKG None  Radiology No results found.  Procedures Procedures (including critical care time)  Medications Ordered in ED Medications  clindamycin (CLEOCIN) IVPB 600 mg (has no administration in time range)     Initial Impression / Assessment and Plan / ED Course  I have reviewed the triage vital signs and the nursing notes.  Pertinent labs & imaging results that were available during my care of the patient were reviewed by me and considered in my medical decision making (see chart for details).  Patient presenting with swelling and pain adjacent to the antecubital fossa of his right arm.  Patient has history of IV drug abuse in the past, however adamantly denies that he has used IV drugs recently.  The wound was incised and drained of a moderate quantity of purulent material.  Patient will be given IV clindamycin, then discharged on clindamycin.  He is to apply warm compresses and return to the emergency department if he experiences additional problems.  Final Clinical Impressions(s) / ED Diagnoses   Final diagnoses:  None    ED Discharge Orders    None       Veryl Speak, MD 06/13/19 [phone]

## 2019-06-13 NOTE — ED Notes (Signed)
Pt verbalized understanding of discharge paperwork, prescriptions and follow-up care 

## 2019-06-13 NOTE — Discharge Instructions (Signed)
Clindamycin as prescribed. ° °Apply warm compresses as frequently as possible for the next several days. ° °Return to the emergency department if symptoms significantly worsen or change. °
